# Patient Record
Sex: Male | Born: 2000 | Race: White | Hispanic: No | Marital: Single | Smoking: Never smoker
Health system: Southern US, Community
[De-identification: ages and names within clinical notes are randomized; demographics above are authoritative.]

## PROBLEM LIST (undated history)

## (undated) DIAGNOSIS — F952 Tourette's disorder: Secondary | ICD-10-CM

## (undated) DIAGNOSIS — F419 Anxiety disorder, unspecified: Secondary | ICD-10-CM

## (undated) DIAGNOSIS — K760 Fatty (change of) liver, not elsewhere classified: Secondary | ICD-10-CM

## (undated) DIAGNOSIS — J45909 Unspecified asthma, uncomplicated: Secondary | ICD-10-CM

## (undated) DIAGNOSIS — F840 Autistic disorder: Secondary | ICD-10-CM

## (undated) DIAGNOSIS — F909 Attention-deficit hyperactivity disorder, unspecified type: Secondary | ICD-10-CM

## (undated) DIAGNOSIS — F913 Oppositional defiant disorder: Secondary | ICD-10-CM

## (undated) DIAGNOSIS — F988 Other specified behavioral and emotional disorders with onset usually occurring in childhood and adolescence: Secondary | ICD-10-CM

## (undated) HISTORY — PX: DENTAL SURGERY: SHX609

## (undated) HISTORY — PX: NO PAST SURGERIES: SHX2092

## (undated) HISTORY — PX: OTHER SURGICAL HISTORY: SHX169

## (undated) HISTORY — DX: Fatty (change of) liver, not elsewhere classified: K76.0

## (undated) HISTORY — DX: Tourette's disorder: F95.2

## (undated) HISTORY — DX: Anxiety disorder, unspecified: F41.9

---
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
## 2022-11-24 ENCOUNTER — Encounter: Payer: Self-pay | Admitting: Gastroenterology

## 2022-11-24 ENCOUNTER — Ambulatory Visit: Payer: Medicaid Other | Admitting: Gastroenterology

## 2022-11-24 ENCOUNTER — Ambulatory Visit (INDEPENDENT_AMBULATORY_CARE_PROVIDER_SITE_OTHER): Payer: Medicaid Other | Admitting: Gastroenterology

## 2022-11-24 ENCOUNTER — Encounter: Payer: Self-pay | Admitting: *Deleted

## 2022-11-24 VITALS — BP 107/70 | HR 106 | Temp 98.0°F | Ht 72.0 in | Wt 199.0 lb

## 2022-11-24 DIAGNOSIS — R1012 Left upper quadrant pain: Secondary | ICD-10-CM | POA: Diagnosis not present

## 2022-11-24 DIAGNOSIS — R1310 Dysphagia, unspecified: Secondary | ICD-10-CM | POA: Diagnosis not present

## 2022-11-24 DIAGNOSIS — R197 Diarrhea, unspecified: Secondary | ICD-10-CM

## 2022-11-24 DIAGNOSIS — K219 Gastro-esophageal reflux disease without esophagitis: Secondary | ICD-10-CM | POA: Diagnosis not present

## 2022-11-24 MED ORDER — DICYCLOMINE HCL 10 MG PO CAPS: 10.0000 mg | ORAL_CAPSULE | Freq: Three times a day (TID) | ORAL | 1 refills | Status: DC

## 2022-11-24 MED ORDER — PANTOPRAZOLE SODIUM 40 MG PO TBEC: 40.0000 mg | DELAYED_RELEASE_TABLET | Freq: Every day | ORAL | 3 refills | Status: DC

## 2022-11-24 NOTE — Patient Instructions (Addendum)
Please have blood work completed at [facility].  We will arrange to have an upper endoscopy with possible stretching of your esophagus in the near future with Dr. Marletta Lor at [facility].  The cause of your diarrhea is not entirely clear.  You may have a component of IBS versus dietary intolerances.  I am checking for inflammation in your colon as well as screening for celiac disease with your blood work.  I also recommend that you follow a lactose-free diet for now or take Lactaid tablets prior to consuming any dairy products.  For your reflux symptoms, start pantoprazole 40 mg once daily 30 minutes before breakfast.  Swallowing precautions:  Eat slowly, take small bites, chew thoroughly, drink plenty of liquids throughout meals.  Avoid trough textures All meats should be chopped finely.  If something gets hung in your esophagus and will not come up or go down, proceed to the emergency room.    The pain that you experience in the left upper abdomen is more consistent with a musculoskeletal cause as your symptoms are related to certain activities.  However, when we perform the upper endoscopy, we will be able to evaluate if there is any inflammation within your stomach contributing to the symptoms.  We will follow-up with you in the office in 3 months or sooner if needed.  It was very nice to meet you today!  Ermalinda Memos, PA-C [facility]

## 2022-11-24 NOTE — Progress Notes (Deleted)
GI Office Note    Referring Provider: Antonietta Jewel Primary Care Physician:  Antonietta Jewel  Primary Gastroenterologist:  Chief Complaint   No chief complaint on file.    History of Present Illness   Adam Gallegos is a 22 y.o. male presenting today          Medications   Current Outpatient Medications  Medication Sig Dispense Refill   albuterol (PROAIR HFA) 108 (90 BASE) MCG/ACT inhaler Inhale 2 puffs into the lungs 4 (four) times daily.     chlorhexidine (HIBICLENS) 4 % external liquid Apply topically daily as needed. 120 mL 0   levocetirizine (XYZAL) 5 MG tablet Take 5 mg by mouth every evening.     montelukast (SINGULAIR) 10 MG tablet Take 10 mg by mouth at bedtime.     mupirocin ointment (BACTROBAN) 2 % Apply 1 application topically 2 (two) times daily. 22 g 0   No current facility-administered medications for this visit.    Allergies   Allergies as of 11/24/2022   (No Known Allergies)    Past Medical History   Past Medical History:  Diagnosis Date   ADD (attention deficit disorder)    ADHD (attention deficit hyperactivity disorder)    ADHD (attention deficit hyperactivity disorder)    Anxiety    Anxiety    Asthma    Autism    Autism    ODD (oppositional defiant disorder)    Tourette's    Tourette's disease     Past Surgical History   Past Surgical History:  Procedure Laterality Date   asthma     NO PAST SURGERIES      Past Family History   Family History  Problem Relation Age of Onset   Cancer Mother     Past Social History   Social History   Socioeconomic History   Marital status: Single    Spouse name: Not on file   Number of children: Not on file   Years of education: Not on file   Highest education level: Not on file  Occupational History   Not on file  Tobacco Use   Smoking status: Never   Smokeless tobacco: Current    Types: Snuff  Vaping Use   Vaping Use: Never used  Substance and Sexual  Activity   Alcohol use: No   Drug use: No   Sexual activity: Yes    Birth control/protection: Condom  Other Topics Concern   Not on file  Social History Narrative   Not on file   Social Determinants of Health   Financial Resource Strain: Not on file  Food Insecurity: Not on file  Transportation Needs: Not on file  Physical Activity: Not on file  Stress: Not on file  Social Connections: Not on file  Intimate Partner Violence: Not on file    Review of Systems   General: Negative for anorexia, weight loss, fever, chills, fatigue, weakness. Eyes: Negative for vision changes.  ENT: Negative for hoarseness, difficulty swallowing , nasal congestion. CV: Negative for chest pain, angina, palpitations, dyspnea on exertion, peripheral edema.  Respiratory: Negative for dyspnea at rest, dyspnea on exertion, cough, sputum, wheezing.  GI: See history of present illness. GU:  Negative for dysuria, hematuria, urinary incontinence, urinary frequency, nocturnal urination.  MS: Negative for joint pain, low back pain.  Derm: Negative for rash or itching.  Neuro: Negative for weakness, abnormal sensation, seizure, frequent headaches, memory loss,  confusion.  Psych: Negative for  anxiety, depression, suicidal ideation, hallucinations.  Endo: Negative for unusual weight change.  Heme: Negative for bruising or bleeding. Allergy: Negative for rash or hives.  Physical Exam   There were no vitals taken for this visit.   General: Well-nourished, well-developed in no acute distress.  Head: Normocephalic, atraumatic.   Eyes: Conjunctiva pink, no icterus. Mouth: Oropharyngeal mucosa moist and pink , no lesions erythema or exudate. Neck: Supple without thyromegaly, masses, or lymphadenopathy.  Lungs: Clear to auscultation bilaterally.  Heart: Regular rate and rhythm, no murmurs rubs or gallops.  Abdomen: Bowel sounds are normal, nontender, nondistended, no hepatosplenomegaly or masses,  no  abdominal bruits or hernia, no rebound or guarding.   Rectal: *** Extremities: No lower extremity edema. No clubbing or deformities.  Neuro: Alert and oriented x 4 , grossly normal neurologically.  Skin: Warm and dry, no rash or jaundice.   Psych: Alert and cooperative, normal mood and affect.  Labs   *** Imaging Studies   No results found.  Assessment       PLAN   ***   Leanna Battles. Melvyn Neth, MHS, PA-C [facility]

## 2022-11-24 NOTE — Progress Notes (Signed)
Referring Provider: Antonietta Jewel Primary Care Physician:  Antonietta Jewel Primary Gastroenterologist:  Dr. Marletta Lor  Chief Complaint  Patient presents with   Abdominal Pain    Left side abdominal pain. Diarrhea right after he eats.     HPI:   Adam Gallegos is a 22 y.o. male presenting today at the request of Dion Saucier, PA-C for chronic diarrhea.   Patient reports started a couple years ago but has been worsening.  Reports he is having more than 3 bowel movements per day, loses track after 2 or 3.  No nocturnal stools.  Bowel movements usually are after meals, but can be throughout the day with no certain food triggers.  Denies BRBPR or melena, unintentional weight loss, abdominal pain related to bowel movements.  Medications tried: Anti diarrheal, but this caused abdominal burning. Has also tried fiber but this didn't help.   Thinks someone in family with colon cancer. Not sure who. No family history of crohn's disease or UC.   Has burning LUQ abdominal pain. More associated with specific activities/picking up his son and holding him.  Not triggered by meals.  No nausea or vomiting.  He did have an episode of sharp upper abdominal pain with nausea and vomiting several months ago and then again a couple weeks ago, but no routine symptoms.  Not sure of the cause.  Has reflux all the time.  Reports this dates back to childhood.  Taking tums. Breads and steak will get hung in his esophagus. No prior EGD.     Past Medical History:  Diagnosis Date   ADD (attention deficit disorder)    ADHD (attention deficit hyperactivity disorder)    ADHD (attention deficit hyperactivity disorder)    Anxiety    Anxiety    Asthma    Autism    Autism    Hepatic steatosis    ODD (oppositional defiant disorder)    Tourette's    Tourette's disease     Past Surgical History:  Procedure Laterality Date   asthma     NO PAST SURGERIES      Current Outpatient Medications   Medication Sig Dispense Refill   albuterol (PROAIR HFA) 108 (90 BASE) MCG/ACT inhaler Inhale 2 puffs into the lungs 4 (four) times daily.     chlorhexidine (HIBICLENS) 4 % external liquid Apply topically daily as needed. 120 mL 0   dicyclomine (BENTYL) 10 MG capsule Take 1 capsule (10 mg total) by mouth 4 (four) times daily -  before meals and at bedtime. 120 capsule 1   levocetirizine (XYZAL) 5 MG tablet Take 5 mg by mouth every evening.     montelukast (SINGULAIR) 10 MG tablet Take 10 mg by mouth at bedtime.     mupirocin ointment (BACTROBAN) 2 % Apply 1 application topically 2 (two) times daily. 22 g 0   pantoprazole (PROTONIX) 40 MG tablet Take 1 tablet (40 mg total) by mouth daily before breakfast. 30 tablet 3   No current facility-administered medications for this visit.    Allergies as of 11/24/2022 - Review Complete 11/24/2022  Allergen Reaction Noted   Penicillins Hives 04/19/2022   Latex  04/28/2020    Family History  Problem Relation Age of Onset   Cancer Mother    Colon cancer Other        Think someone in his family had colon cancer, not sure who   Inflammatory bowel disease Neg Hx     Social History  Socioeconomic History   Marital status: Single    Spouse name: Not on file   Number of children: Not on file   Years of education: Not on file   Highest education level: Not on file  Occupational History   Not on file  Tobacco Use   Smoking status: Never   Smokeless tobacco: Current    Types: Snuff  Vaping Use   Vaping Use: Never used  Substance and Sexual Activity   Alcohol use: No   Drug use: No   Sexual activity: Yes    Birth control/protection: Condom  Other Topics Concern   Not on file  Social History Narrative   Not on file   Social Determinants of Health   Financial Resource Strain: Not on file  Food Insecurity: Not on file  Transportation Needs: Not on file  Physical Activity: Not on file  Stress: Not on file  Social Connections: Not on  file  Intimate Partner Violence: Not on file    Review of Systems: Gen: Denies any fever, chills, cold or flulike symptoms, presyncope, syncope. CV: Denies chest pain, heart palpitations. Resp: Denies shortness of breath, cough. GI: See HPI GU : Denies urinary burning, urinary frequency, urinary hesitancy MS: Denies joint pain. Derm: Denies rash. Psych: Denies depression, anxiety. Heme: See HPI  Physical Exam: BP 107/70 (BP Location: Right Arm, Patient Position: Sitting, Cuff Size: Normal)   Pulse (!) 106   Temp 98 F (36.7 C) (Temporal)   Ht 6' (1.829 m)   Wt 199 lb (90.3 kg)   SpO2 96%   BMI 26.99 kg/m  General:   Alert and oriented. Pleasant and cooperative. Well-nourished and well-developed.  Head:  Normocephalic and atraumatic. Eyes:  Without icterus, sclera clear and conjunctiva pink.  Ears:  Normal auditory acuity. Lungs:  Clear to auscultation bilaterally. No wheezes, rales, or rhonchi. No distress.  Heart:  S1, S2 present without murmurs appreciated.  Abdomen:  +BS, soft, non-tender and non-distended. No HSM noted. No guarding or rebound. No masses appreciated.  Rectal:  Deferred  Msk:  Symmetrical without gross deformities. Normal posture. Extremities:  Without edema. Neurologic:  Alert and  oriented x4;  grossly normal neurologically. Skin:  Intact without significant lesions or rashes. Psych:  Normal mood and affect.    Assessment:  22 year old male presenting today at the request of Adline Potter, PA-C for chronic diarrhea.  Patient also reporting LUQ abdominal pain, GERD, dysphagia.  Chronic diarrhea: Started 2 years ago, but has been worsening, now with daily loose stools, often after meals, but can occur throughout the day as well.  No BRBPR, melena, unintentional weight loss.  He does have abdominal pain related to his bowel movements.  Recent TSH within normal limits.  Suspect IBS, possible dietary intolerances.  I will plan to start him on dicyclomine,  but will also screen for celiac disease and check inflammatory markers.  LUQ abdominal pain: Suspect MSK etiology as this is usually triggered by certain activities/picking up his son.  Recent labs on file in May with normal LFTs.  No association with meals though he does have chronic, daily GERD could very well have gastritis.  Additionally, he has dysphagia as discussed below for which we are pursuing an EGD.  This will help rule out GI sources of his abdominal pain.  GERD: Chronic.  Uncontrolled with daily symptoms, currently taking Tums.  I will start him on pantoprazole 40 mg daily.  Dysphagia: Reports intermittent solid food dysphagia.  This is in  the setting of uncontrolled GERD.  May have esophageal web, ring, stricture.  Additionally, he does have history of asthma, allergies, and could have EOE.  We will arrange for an upper endoscopy with possible dilation.  Plan:  IgA, TTG IgA, CRP, sed rate Proceed with upper endoscopy +/- dilation with propofol by Dr. Marletta Lor in near future. The risks, benefits, and alternatives have been discussed with the patient in detail. The patient states understanding and desires to proceed.  ASA 2 Dicyclomine 10 mg up to 3 times daily before meals and at bedtime. Lactose-free diet or Lactaid tablets prior to dairy consumption. Start pantoprazole 40 mg daily 30 minutes before breakfast. Swallowing precautions discussed.  Separate written instructions provided on AVS. Follow-up in 3 months or sooner if needed.   Ermalinda Memos, PA-C [facility] 11/24/2022

## 2022-11-24 NOTE — H&P (View-Only) (Signed)
 Referring Provider: Boles, Alyssa H, PA-C Primary Care Physician:  Boles, Alyssa H, PA-C Primary Gastroenterologist:  Dr. Carver  Chief Complaint  Patient presents with   Abdominal Pain    Left side abdominal pain. Diarrhea right after he eats.     HPI:   Adam Gallegos is a 21 y.o. male presenting today at the request of Boles, Alyssa H, PA-C for chronic diarrhea.   Patient reports started a couple years ago but has been worsening.  Reports he is having more than 3 bowel movements per day, loses track after 2 or 3.  No nocturnal stools.  Bowel movements usually are after meals, but can be throughout the day with no certain food triggers.  Denies BRBPR or melena, unintentional weight loss, abdominal pain related to bowel movements.  Medications tried: Anti diarrheal, but this caused abdominal burning. Has also tried fiber but this didn't help.   Thinks someone in family with colon cancer. Not sure who. No family history of crohn's disease or UC.   Has burning LUQ abdominal pain. More associated with specific activities/picking up his son and holding him.  Not triggered by meals.  No nausea or vomiting.  He did have an episode of sharp upper abdominal pain with nausea and vomiting several months ago and then again a couple weeks ago, but no routine symptoms.  Not sure of the cause.  Has reflux all the time.  Reports this dates back to childhood.  Taking tums. Breads and steak will get hung in his esophagus. No prior EGD.     Past Medical History:  Diagnosis Date   ADD (attention deficit disorder)    ADHD (attention deficit hyperactivity disorder)    ADHD (attention deficit hyperactivity disorder)    Anxiety    Anxiety    Asthma    Autism    Autism    Hepatic steatosis    ODD (oppositional defiant disorder)    Tourette's    Tourette's disease     Past Surgical History:  Procedure Laterality Date   asthma     NO PAST SURGERIES      Current Outpatient Medications   Medication Sig Dispense Refill   albuterol (PROAIR HFA) 108 (90 BASE) MCG/ACT inhaler Inhale 2 puffs into the lungs 4 (four) times daily.     chlorhexidine (HIBICLENS) 4 % external liquid Apply topically daily as needed. 120 mL 0   dicyclomine (BENTYL) 10 MG capsule Take 1 capsule (10 mg total) by mouth 4 (four) times daily -  before meals and at bedtime. 120 capsule 1   levocetirizine (XYZAL) 5 MG tablet Take 5 mg by mouth every evening.     montelukast (SINGULAIR) 10 MG tablet Take 10 mg by mouth at bedtime.     mupirocin ointment (BACTROBAN) 2 % Apply 1 application topically 2 (two) times daily. 22 g 0   pantoprazole (PROTONIX) 40 MG tablet Take 1 tablet (40 mg total) by mouth daily before breakfast. 30 tablet 3   No current facility-administered medications for this visit.    Allergies as of 11/24/2022 - Review Complete 11/24/2022  Allergen Reaction Noted   Penicillins Hives 04/19/2022   Latex  04/28/2020    Family History  Problem Relation Age of Onset   Cancer Mother    Colon cancer Other        Think someone in his family had colon cancer, not sure who   Inflammatory bowel disease Neg Hx     Social History     Socioeconomic History   Marital status: Single    Spouse name: Not on file   Number of children: Not on file   Years of education: Not on file   Highest education level: Not on file  Occupational History   Not on file  Tobacco Use   Smoking status: Never   Smokeless tobacco: Current    Types: Snuff  Vaping Use   Vaping Use: Never used  Substance and Sexual Activity   Alcohol use: No   Drug use: No   Sexual activity: Yes    Birth control/protection: Condom  Other Topics Concern   Not on file  Social History Narrative   Not on file   Social Determinants of Health   Financial Resource Strain: Not on file  Food Insecurity: Not on file  Transportation Needs: Not on file  Physical Activity: Not on file  Stress: Not on file  Social Connections: Not on  file  Intimate Partner Violence: Not on file    Review of Systems: Gen: Denies any fever, chills, cold or flulike symptoms, presyncope, syncope. CV: Denies chest pain, heart palpitations. Resp: Denies shortness of breath, cough. GI: See HPI GU : Denies urinary burning, urinary frequency, urinary hesitancy MS: Denies joint pain. Derm: Denies rash. Psych: Denies depression, anxiety. Heme: See HPI  Physical Exam: BP 107/70 (BP Location: Right Arm, Patient Position: Sitting, Cuff Size: Normal)   Pulse (!) 106   Temp 98 F (36.7 C) (Temporal)   Ht 6' (1.829 m)   Wt 199 lb (90.3 kg)   SpO2 96%   BMI 26.99 kg/m  General:   Alert and oriented. Pleasant and cooperative. Well-nourished and well-developed.  Head:  Normocephalic and atraumatic. Eyes:  Without icterus, sclera clear and conjunctiva pink.  Ears:  Normal auditory acuity. Lungs:  Clear to auscultation bilaterally. No wheezes, rales, or rhonchi. No distress.  Heart:  S1, S2 present without murmurs appreciated.  Abdomen:  +BS, soft, non-tender and non-distended. No HSM noted. No guarding or rebound. No masses appreciated.  Rectal:  Deferred  Msk:  Symmetrical without gross deformities. Normal posture. Extremities:  Without edema. Neurologic:  Alert and  oriented x4;  grossly normal neurologically. Skin:  Intact without significant lesions or rashes. Psych:  Normal mood and affect.    Assessment:  21-year-old male presenting today at the request of Alyssa Bowles, PA-C for chronic diarrhea.  Patient also reporting LUQ abdominal pain, GERD, dysphagia.  Chronic diarrhea: Started 2 years ago, but has been worsening, now with daily loose stools, often after meals, but can occur throughout the day as well.  No BRBPR, melena, unintentional weight loss.  He does have abdominal pain related to his bowel movements.  Recent TSH within normal limits.  Suspect IBS, possible dietary intolerances.  I will plan to start him on dicyclomine,  but will also screen for celiac disease and check inflammatory markers.  LUQ abdominal pain: Suspect MSK etiology as this is usually triggered by certain activities/picking up his son.  Recent labs on file in May with normal LFTs.  No association with meals though he does have chronic, daily GERD could very well have gastritis.  Additionally, he has dysphagia as discussed below for which we are pursuing an EGD.  This will help rule out GI sources of his abdominal pain.  GERD: Chronic.  Uncontrolled with daily symptoms, currently taking Tums.  I will start him on pantoprazole 40 mg daily.  Dysphagia: Reports intermittent solid food dysphagia.  This is in   the setting of uncontrolled GERD.  May have esophageal web, ring, stricture.  Additionally, he does have history of asthma, allergies, and could have EOE.  We will arrange for an upper endoscopy with possible dilation.  Plan:  IgA, TTG IgA, CRP, sed rate Proceed with upper endoscopy +/- dilation with propofol by Dr. Carver in near future. The risks, benefits, and alternatives have been discussed with the patient in detail. The patient states understanding and desires to proceed.  ASA 2 Dicyclomine 10 mg up to 3 times daily before meals and at bedtime. Lactose-free diet or Lactaid tablets prior to dairy consumption. Start pantoprazole 40 mg daily 30 minutes before breakfast. Swallowing precautions discussed.  Separate written instructions provided on AVS. Follow-up in 3 months or sooner if needed.   Macel Yearsley, PA-C [facility] 11/24/2022   

## 2022-11-26 ENCOUNTER — Encounter: Payer: Self-pay | Admitting: Gastroenterology

## 2022-11-26 DIAGNOSIS — R1310 Dysphagia, unspecified: Secondary | ICD-10-CM | POA: Insufficient documentation

## 2022-11-26 DIAGNOSIS — K219 Gastro-esophageal reflux disease without esophagitis: Secondary | ICD-10-CM | POA: Insufficient documentation

## 2022-11-26 DIAGNOSIS — R1012 Left upper quadrant pain: Secondary | ICD-10-CM | POA: Insufficient documentation

## 2022-11-26 DIAGNOSIS — R197 Diarrhea, unspecified: Secondary | ICD-10-CM | POA: Insufficient documentation

## 2022-11-29 ENCOUNTER — Telehealth: Payer: Self-pay | Admitting: *Deleted

## 2022-11-29 NOTE — Telephone Encounter (Signed)
Spoke with pt. He is aware Dr. Marletta Lor not available on 7/2 for procedure. Rescheduled to 7/10. Aware will get a pre-op phone call 1-2 days prior. Endo aware of new appt. New Instructions sent.

## 2022-11-30 ENCOUNTER — Encounter: Payer: Self-pay | Admitting: Gastroenterology

## 2022-12-13 ENCOUNTER — Encounter (HOSPITAL_COMMUNITY)
Admission: RE | Admit: 2022-12-13 | Discharge: 2022-12-13 | Disposition: A | Discharge status: Home / Self Care | Payer: MEDICAID | Source: Ambulatory Visit | Attending: Internal Medicine | Admitting: Internal Medicine

## 2022-12-13 ENCOUNTER — Encounter (HOSPITAL_COMMUNITY): Payer: Self-pay

## 2022-12-13 NOTE — Pre-Procedure Instructions (Signed)
Attempted pre-op phone call. Left VM for him to call us back. 

## 2022-12-15 ENCOUNTER — Encounter (HOSPITAL_COMMUNITY)
Admission: RE | Disposition: A | Discharge status: Home / Self Care | Payer: Self-pay | Source: Home / Self Care | Attending: Internal Medicine

## 2022-12-15 ENCOUNTER — Encounter (HOSPITAL_COMMUNITY): Payer: Self-pay

## 2022-12-15 ENCOUNTER — Ambulatory Visit (HOSPITAL_COMMUNITY)
Admission: RE | Admit: 2022-12-15 | Discharge: 2022-12-15 | Disposition: A | Discharge status: Home / Self Care | Payer: MEDICAID | Attending: Internal Medicine | Admitting: Internal Medicine

## 2022-12-15 ENCOUNTER — Ambulatory Visit (HOSPITAL_COMMUNITY): Payer: MEDICAID | Admitting: Anesthesiology

## 2022-12-15 DIAGNOSIS — K2970 Gastritis, unspecified, without bleeding: Secondary | ICD-10-CM

## 2022-12-15 DIAGNOSIS — R1310 Dysphagia, unspecified: Secondary | ICD-10-CM | POA: Diagnosis not present

## 2022-12-15 DIAGNOSIS — K200 Eosinophilic esophagitis: Secondary | ICD-10-CM | POA: Diagnosis not present

## 2022-12-15 DIAGNOSIS — K449 Diaphragmatic hernia without obstruction or gangrene: Secondary | ICD-10-CM

## 2022-12-15 DIAGNOSIS — K529 Noninfective gastroenteritis and colitis, unspecified: Secondary | ICD-10-CM | POA: Diagnosis not present

## 2022-12-15 DIAGNOSIS — J45909 Unspecified asthma, uncomplicated: Secondary | ICD-10-CM | POA: Diagnosis not present

## 2022-12-15 DIAGNOSIS — R12 Heartburn: Secondary | ICD-10-CM | POA: Diagnosis not present

## 2022-12-15 DIAGNOSIS — R1012 Left upper quadrant pain: Secondary | ICD-10-CM | POA: Diagnosis not present

## 2022-12-15 DIAGNOSIS — K2100 Gastro-esophageal reflux disease with esophagitis, without bleeding: Secondary | ICD-10-CM | POA: Diagnosis not present

## 2022-12-15 DIAGNOSIS — K219 Gastro-esophageal reflux disease without esophagitis: Secondary | ICD-10-CM

## 2022-12-15 HISTORY — PX: BIOPSY: SHX5522

## 2022-12-15 HISTORY — PX: BALLOON DILATION: SHX5330

## 2022-12-15 HISTORY — PX: ESOPHAGOGASTRODUODENOSCOPY (EGD) WITH PROPOFOL: SHX5813

## 2022-12-15 SURGERY — ESOPHAGOGASTRODUODENOSCOPY (EGD) WITH PROPOFOL
Anesthesia: General

## 2022-12-15 MED ORDER — LIDOCAINE HCL (PF) 2 % IJ SOLN
INTRAMUSCULAR | Status: DC | PRN
  Administered 2022-12-15: 50 mg via INTRADERMAL

## 2022-12-15 MED ORDER — PROPOFOL 500 MG/50ML IV EMUL
INTRAVENOUS | Status: DC | PRN
  Administered 2022-12-15: 200 ug/kg/min via INTRAVENOUS

## 2022-12-15 MED ORDER — PANTOPRAZOLE SODIUM 40 MG PO TBEC: 40.0000 mg | DELAYED_RELEASE_TABLET | Freq: Two times a day (BID) | ORAL | 11 refills | Status: DC

## 2022-12-15 MED ORDER — LACTATED RINGERS IV SOLN: INTRAVENOUS | Status: DC

## 2022-12-15 MED ORDER — PROPOFOL 500 MG/50ML IV EMUL
INTRAVENOUS | Status: AC
  Filled 2022-12-15: qty 50

## 2022-12-15 MED ORDER — PROPOFOL 10 MG/ML IV BOLUS
INTRAVENOUS | Status: DC | PRN
  Administered 2022-12-15: 150 mg via INTRAVENOUS

## 2022-12-15 NOTE — Op Note (Signed)
[facility] Patient Name: Adam Gallegos Procedure Date: 12/15/2022 2:05 PM MRN: [id number] Date of Birth: January 11, 2001 Attending MD: Hennie Duos. Marletta Lor , [place], [id number] CSN: [id number] Age: 22 Admit Type: Outpatient Procedure:                Upper GI endoscopy Indications:              Dysphagia, Heartburn Providers:                Hennie Duos. Marletta Lor, DO, Nena Polio, RN, Zena Amos Referring MD:              Medicines:                See the Anesthesia note for documentation of the                            administered medications Complications:            No immediate complications. Estimated Blood Loss:     Estimated blood loss was minimal. Procedure:                Pre-Anesthesia Assessment:                           - The anesthesia plan was to use monitored                            anesthesia care (MAC).                           After obtaining informed consent, the endoscope was                            passed under direct vision. Throughout the                            procedure, the patient's blood pressure, pulse, and                            oxygen saturations were monitored continuously. The                            [id number]-H190 ([id number]) scope was introduced through the                            mouth, and advanced to the second part of duodenum.                            The upper GI endoscopy was accomplished without                            difficulty. The patient tolerated the procedure                            well. Scope In: 2:19:12 PM Scope  Out: 2:23:21 PM Total Procedure Duration: 0 hours 4 minutes 9 seconds  Findings:      Mucosal changes including feline appearance, longitudinal furrows and       white plaques were found in the middle third of the esophagus. Biopsies       were taken with a cold forceps for histology.      The Z-line was irregular. Biopsies were taken with a cold forceps for        histology.      A small hiatal hernia was present.      Patchy mild inflammation characterized by erythema was found in the       gastric body. Biopsies were taken with a cold forceps for Helicobacter       pylori testing.      The duodenal bulb, first portion of the duodenum and second portion of       the duodenum were normal. Impression:               - Esophageal mucosal changes suggestive of                            eosinophilic esophagitis. Biopsied.                           - Z-line irregular. Biopsied.                           - Small hiatal hernia.                           - Gastritis. Biopsied.                           - Normal duodenal bulb, first portion of the                            duodenum and second portion of the duodenum. Moderate Sedation:      Per Anesthesia Care Recommendation:           - Patient has a contact number available for                            emergencies. The signs and symptoms of potential                            delayed complications were discussed with the                            patient. Return to normal activities tomorrow.                            Written discharge instructions were provided to the                            patient.                           - Resume previous diet.                           -  Continue present medications.                           - Await pathology results.                           - Repeat upper endoscopy after studies are complete                            for surveillance based on pathology results.                           - Return to GI clinic in 6 weeks.                           - Use a proton pump inhibitor PO BID. Procedure Code(s):        --- Professional ---                           [phone], Esophagogastroduodenoscopy, flexible,                            transoral; with biopsy, single or multiple Diagnosis Code(s):        --- Professional ---                           K22.89,  Other specified disease of esophagus                           K44.9, Diaphragmatic hernia without obstruction or                            gangrene                           K29.70, Gastritis, unspecified, without bleeding                           R13.10, Dysphagia, unspecified                           R12, Heartburn CPT copyright 2022 American Medical Association. All rights reserved. The codes documented in this report are preliminary and upon coder review may  be revised to meet current compliance requirements. Hennie Duos. Marletta Lor, DO Hennie Duos. Marletta Lor, DO 12/15/2022 2:26:35 PM This report has been signed electronically. Number of Addenda: 0

## 2022-12-15 NOTE — Discharge Instructions (Addendum)
EGD Discharge instructions Please read the instructions outlined below and refer to this sheet in the next few weeks. These discharge instructions provide you with general information on caring for yourself after you leave the hospital. Your doctor may also give you specific instructions. While your treatment has been planned according to the most current medical practices available, unavoidable complications occasionally occur. If you have any problems or questions after discharge, please call your doctor. ACTIVITY You may resume your regular activity but move at a slower pace for the next 24 hours.  Take frequent rest periods for the next 24 hours.  Walking will help expel (get rid of) the air and reduce the bloated feeling in your abdomen.  No driving for 24 hours (because of the anesthesia (medicine) used during the test).  You may shower.  Do not sign any important legal documents or operate any machinery for 24 hours (because of the anesthesia used during the test).  NUTRITION Drink plenty of fluids.  You may resume your normal diet.  Begin with a light meal and progress to your normal diet.  Avoid alcoholic beverages for 24 hours or as instructed by your caregiver.  MEDICATIONS You may resume your normal medications unless your caregiver tells you otherwise.  WHAT YOU CAN EXPECT TODAY You may experience abdominal discomfort such as a feeling of fullness or "gas" pains.  FOLLOW-UP Your doctor will discuss the results of your test with you.  SEEK IMMEDIATE MEDICAL ATTENTION IF ANY OF THE FOLLOWING OCCUR: Excessive nausea (feeling sick to your stomach) and/or vomiting.  Severe abdominal pain and distention (swelling).  Trouble swallowing.  Temperature over 101 F (37.8 C).  Rectal bleeding or vomiting of blood.   Your upper endoscopy revealed findings consistent with eosinophilic esophagitis which is an allergy mediated process.  I took samples of your esophagus as well as your  stomach.  Small bowel appeared normal.  I am going to increase your pantoprazole to twice daily.  Await pathology results, my office will contact you.  Follow-up in GI office in 6 to 8 weeks.   I hope you have a great rest of your week!  Hennie Duos. Marletta Lor, D.O. Gastroenterology and Hepatology [facility]

## 2022-12-15 NOTE — Transfer of Care (Signed)
Immediate Anesthesia Transfer of Care Note  Patient: Adam Gallegos  Procedure(s) Performed: ESOPHAGOGASTRODUODENOSCOPY (EGD) WITH PROPOFOL BALLOON DILATION BIOPSY  Patient Location: Short Stay  Anesthesia Type:General  Level of Consciousness: awake, alert , and oriented  Airway & Oxygen Therapy: Patient Spontanous Breathing  Post-op Assessment: Report given to RN, Post -op Vital signs reviewed and stable, Patient moving all extremities X 4, and Patient able to stick tongue midline  Post vital signs: Reviewed  Last Vitals:  Vitals Value Taken Time  BP 99/40   Temp 98.6   Pulse 97   Resp 25   SpO2 96     Last Pain: There were no vitals filed for this visit.       Complications: No notable events documented.

## 2022-12-15 NOTE — Anesthesia Postprocedure Evaluation (Signed)
Anesthesia Post Note  Patient: Adam Gallegos  Procedure(s) Performed: ESOPHAGOGASTRODUODENOSCOPY (EGD) WITH PROPOFOL BALLOON DILATION BIOPSY  Patient location during evaluation: Phase II Anesthesia Type: General Level of consciousness: awake and alert and oriented Pain management: pain level controlled Vital Signs Assessment: post-procedure vital signs reviewed and stable Respiratory status: spontaneous breathing, nonlabored ventilation and respiratory function stable Cardiovascular status: blood pressure returned to baseline and stable Postop Assessment: no apparent nausea or vomiting Anesthetic complications: no  No notable events documented.   Last Vitals:  Vitals:   12/15/22 1432 12/15/22 1437  BP: (!) 79/40 98/60  Pulse: (!) 102   Resp: (!) 26   Temp: 36.9 C   SpO2: 98%     Last Pain:  Vitals:   12/15/22 1432  TempSrc: Oral  PainSc: 0-No pain                 Javohn Basey C Sohail Capraro

## 2022-12-15 NOTE — Anesthesia Preprocedure Evaluation (Signed)
Anesthesia Evaluation  Patient identified by MRN, date of birth, ID band Patient awake    Reviewed: Allergy & Precautions, H&P , NPO status , Patient's Chart, lab work & pertinent test results  Airway Mallampati: II  TM Distance: >3 FB Neck ROM: Full    Dental  (+) Chipped, Dental Advisory Given, Poor Dentition   Pulmonary asthma , Patient abstained from smoking.          Cardiovascular negative cardio ROS Normal cardiovascular exam Rhythm:Regular Rate:Normal     Neuro/Psych  PSYCHIATRIC DISORDERS Anxiety     negative neurological ROS     GI/Hepatic Neg liver ROS,GERD  Medicated and Poorly Controlled,,  Endo/Other  negative endocrine ROS    Renal/GU negative Renal ROS  negative genitourinary   Musculoskeletal negative musculoskeletal ROS (+)    Abdominal   Peds  (+) ADHD Hematology negative hematology ROS (+)   Anesthesia Other Findings   Reproductive/Obstetrics negative OB ROS                             Anesthesia Physical Anesthesia Plan  ASA: 2  Anesthesia Plan: General   Post-op Pain Management: Minimal or no pain anticipated   Induction: Intravenous  PONV Risk Score and Plan: 1 and Propofol infusion  Airway Management Planned: Nasal Cannula and Natural Airway  Additional Equipment:   Intra-op Plan:   Post-operative Plan:   Informed Consent: I have reviewed the patients History and Physical, chart, labs and discussed the procedure including the risks, benefits and alternatives for the proposed anesthesia with the patient or authorized representative who has indicated his/her understanding and acceptance.     Dental advisory given  Plan Discussed with: CRNA and Surgeon  Anesthesia Plan Comments:        Anesthesia Quick Evaluation

## 2022-12-15 NOTE — Interval H&P Note (Signed)
History and Physical Interval Note:  12/15/2022 1:54 PM  Adam Gallegos  has presented today for surgery, with the diagnosis of dysphagia,LUQ pain,GERD.  The various methods of treatment have been discussed with the patient and family. After consideration of risks, benefits and other options for treatment, the patient has consented to  Procedure(s) with comments: ESOPHAGOGASTRODUODENOSCOPY (EGD) WITH PROPOFOL (N/A) - 2:30 pm, asa 2 BALLOON DILATION (N/A) as a surgical intervention.  The patient's history has been reviewed, patient examined, no change in status, stable for surgery.  I have reviewed the patient's chart and labs.  Questions were answered to the patient's satisfaction.     Lanelle Bal

## 2022-12-17 LAB — SURGICAL PATHOLOGY

## 2022-12-21 ENCOUNTER — Telehealth: Payer: Self-pay | Admitting: Internal Medicine

## 2022-12-21 ENCOUNTER — Encounter (HOSPITAL_COMMUNITY): Payer: Self-pay | Admitting: Internal Medicine

## 2022-12-21 NOTE — Telephone Encounter (Signed)
Phoned the pt and advised him once I get his results from Dr Marletta Lor I will call him. Pt expressed understanding

## 2022-12-21 NOTE — Telephone Encounter (Signed)
I called patient to reschedule his post procedure follow up with Ermalinda Memos, PA. Pt is starting a course at the [employer] and may need to reschedule due to his schedule. He was also asking about his EGD results. Please call [phone]

## 2023-01-06 ENCOUNTER — Ambulatory Visit: Payer: MEDICAID | Admitting: Gastroenterology

## 2023-01-06 ENCOUNTER — Encounter: Payer: Self-pay | Admitting: Gastroenterology

## 2023-01-06 VITALS — BP 98/61 | HR 87 | Temp 98.1°F | Ht 72.0 in | Wt 196.8 lb

## 2023-01-06 DIAGNOSIS — K219 Gastro-esophageal reflux disease without esophagitis: Secondary | ICD-10-CM | POA: Diagnosis not present

## 2023-01-06 DIAGNOSIS — R1310 Dysphagia, unspecified: Secondary | ICD-10-CM

## 2023-01-06 DIAGNOSIS — R112 Nausea with vomiting, unspecified: Secondary | ICD-10-CM

## 2023-01-06 DIAGNOSIS — R1084 Generalized abdominal pain: Secondary | ICD-10-CM

## 2023-01-06 DIAGNOSIS — K529 Noninfective gastroenteritis and colitis, unspecified: Secondary | ICD-10-CM

## 2023-01-06 MED ORDER — LIDOCAINE VISCOUS HCL 2 % MT SOLN: 15.0000 mL | OROMUCOSAL | 0 refills | Status: DC | PRN

## 2023-01-06 MED ORDER — DICYCLOMINE HCL 20 MG PO TABS: 20.0000 mg | ORAL_TABLET | Freq: Every day | ORAL | 1 refills | Status: DC | PRN

## 2023-01-06 MED ORDER — ONDANSETRON 4 MG PO TBDP: 4.0000 mg | ORAL_TABLET | Freq: Three times a day (TID) | ORAL | 0 refills | Status: DC | PRN

## 2023-01-06 MED ORDER — LIDOCAINE VISCOUS HCL 2 % MT SOLN: OROMUCOSAL | 0 refills | Status: DC

## 2023-01-06 MED ORDER — OMEPRAZOLE 40 MG PO CPDR: 40.0000 mg | DELAYED_RELEASE_CAPSULE | Freq: Two times a day (BID) | ORAL | 3 refills | Status: DC

## 2023-01-06 MED ORDER — ALUM & MAG HYDROXIDE-SIMETH 400-400-40 MG/5ML PO SUSP: 10.0000 mL | Freq: Four times a day (QID) | ORAL | 0 refills | Status: DC | PRN

## 2023-01-06 MED ORDER — SUCRALFATE 1 G PO TABS: 1.0000 g | ORAL_TABLET | Freq: Three times a day (TID) | ORAL | 0 refills | Status: DC

## 2023-01-06 NOTE — Progress Notes (Signed)
GI Office Note    Referring Provider: Antonietta Jewel Primary Care Physician:  Antonietta Jewel Primary Gastroenterologist: Hennie Duos. Marletta Lor, Adam Gallegos  Date:  01/06/2023  ID:  Adam Gallegos, DOB February 19, 2001, MRN [id number]   Chief Complaint   Chief Complaint  Patient presents with   Follow-up    Pt states still having abd pain (severe), throwing up, diarrhea for several days.   History of Present Illness  Adam Gallegos is a 22 y.o. male with a history of anxiety, asthma, ADHD, GERD, chronic diarrhea presenting today with complaint of ongoing diarrhea.  Labs in May 2024 with normal CBC, CMP, and TSH.  Seen for initial consultation 11/24/2022 by Ermalinda Memos, PA.  Patient reported a couple years of diarrhea that was recently worsened.  Having at least 3 bowel movements per day, sometimes more than this.  Denies any nocturnal stools.  Bowel movements usually occurring after meals but can also be throughout the day without certain food triggers.  Does have abdominal pain related to bowel movements but no weight loss.  Has tried antidiarrheals but caused abdominal burning as well as fiber supplementation which also has not helped.  Also having burning in the left upper quadrant associated with picking up his son.  Denies nausea or vomiting currently but did happen several months prior and a couple weeks prior.  Has had constant reflux since childhood.  Also with complaints of dysphagia with bread and steak.  Advised to check celiac labs, CRP, and sed rate and proceed with upper endoscopy with dilation.  Given dicyclomine for diarrhea and abdominal pain. Advise lactose-free diet. Started on daily PPI.  Previously reported possible family history of colon cancer but unsure who.  Denied any family history of IBD.  EGD 12/15/2022: -Esophageal mucosal changes suggestive of eosinophilic esophagitis s/p biopsy -Irregular Z-line s/p biopsy -Small hiatal hernia -Gastritis s/p  biopsy -Normal duodenum -Path: Gastric biopsy negative for H. pylori but with mild reactive changes.  Esophageal biopsy with benign mucosa but compatible with eosinophilic esophagitis.  GE junction biopsies with mild reflux changes -Advised PPI twice daily  Today:  Pizza rolls and pizza last night  he started still having cramping and abdominal pain. Vomited a lot last night and not eating well. Has been nauseas the last 24 hours. Typically bentyl helps. Right now it constantly feels like a hard abdomen. Right now hurts to eat or drink anything. Has tried but is feeling dehydrated.  Continues to feel nauseous.  Some days does not eat much at all and has poor appetite.   Has taken PPI once in a blue moon but states it caused him constipation after taking it initially after his upper endoscopy.  Urine is dark yellow. Forgot to perform prior labs ordered.   Has been fine since the EGD up until yesterday with the pizza.   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    Current Outpatient Medications  Medication Sig Dispense Refill   albuterol (PROAIR HFA) 108 (90 BASE) MCG/ACT inhaler Inhale 2 puffs into the lungs every 6 (six) hours as needed (Asthma).     albuterol (PROVENTIL) (2.5 MG/3ML) 0.083% nebulizer solution Take 2.5 mg by nebulization every 4 (four) hours as needed (Asthma).     cromolyn (OPTICROM) 4 % ophthalmic solution Place 1 drop into both eyes in the morning and at bedtime.  dicyclomine (BENTYL) 10 MG capsule Take 1 capsule (10 mg total) by mouth 4 (four) times daily -  before meals and at bedtime. 120 capsule 1   levocetirizine (XYZAL) 5 MG tablet Take 5 mg by mouth every evening.     montelukast (SINGULAIR) 10 MG tablet Take 10 mg by mouth at bedtime.     pantoprazole (PROTONIX) 40 MG tablet Take 1 tablet (40 mg total) by mouth 2 (two) times daily. 60 tablet 11   cefPROZIL  (CEFZIL) 250 MG tablet Take 250 mg by mouth daily. (Patient not taking: Reported on 01/06/2023)     chlorhexidine (HIBICLENS) 4 % external liquid Apply topically daily as needed. (Patient not taking: Reported on 12/10/2022) 120 mL 0   dicyclomine (BENTYL) 20 MG tablet Take 20 mg by mouth daily as needed for spasms (sublingual). (Patient not taking: Reported on 01/06/2023)     mupirocin ointment (BACTROBAN) 2 % Apply 1 application topically 2 (two) times daily. (Patient not taking: Reported on 12/10/2022) 22 g 0   No current facility-administered medications for this visit.    Past Medical History:  Diagnosis Date   ADD (attention deficit disorder)    ADHD (attention deficit hyperactivity disorder)    ADHD (attention deficit hyperactivity disorder)    Anxiety    Anxiety    Asthma    Autism    Autism    Hepatic steatosis    ODD (oppositional defiant disorder)    Tourette's    Tourette's disease     Past Surgical History:  Procedure Laterality Date   asthma     BALLOON DILATION N/A 12/15/2022   Procedure: BALLOON DILATION;  Surgeon: Lanelle Bal, Adam Gallegos;  [facility];  Service: Endoscopy;  Laterality: N/A;   BIOPSY  12/15/2022   Procedure: BIOPSY;  Surgeon: Lanelle Bal, Adam Gallegos;  [facility];  Service: Endoscopy;;   DENTAL SURGERY     ESOPHAGOGASTRODUODENOSCOPY (EGD) WITH PROPOFOL N/A 12/15/2022   Procedure: ESOPHAGOGASTRODUODENOSCOPY (EGD) WITH PROPOFOL;  Surgeon: Lanelle Bal, Adam Gallegos;  [facility];  Service: Endoscopy;  Laterality: N/A;  2:30 pm, asa 2   NO PAST SURGERIES      Family History  Problem Relation Age of Onset   Cancer Mother    Colon cancer Other        Think someone in his family had colon cancer, not sure who   Inflammatory bowel disease Neg Hx     Allergies as of 01/06/2023 - Review Complete 01/06/2023  Allergen Reaction Noted   Penicillins Hives 04/19/2022   Coconut fatty acids Other (See Comments) 04/19/2022   Latex Hives  04/28/2020    Social History   Socioeconomic History   Marital status: Single    Spouse name: Not on file   Number of children: Not on file   Years of education: Not on file   Highest education level: Not on file  Occupational History   Not on file  Tobacco Use   Smoking status: Never   Smokeless tobacco: Current    Types: Snuff  Vaping Use   Vaping status: Never Used  Substance and Sexual Activity   Alcohol use: No   Drug use: No   Sexual activity: Yes    Birth control/protection: Condom  Other Topics Concern   Not on file  Social History Narrative   Not on file   Social Determinants of Health   Financial Resource Strain: Not on file  Food Insecurity: Not on file  Transportation Needs: Not on file  Physical Activity: Not on file  Stress: Not on file  Social Connections: Not on file   Review of Systems   Gen: Denies fever, chills, anorexia. Denies fatigue, weakness, weight loss.  CV: Denies chest pain, palpitations, syncope, peripheral edema, and claudication. Resp: Denies dyspnea at rest, cough, wheezing, coughing up blood, and pleurisy. GI: See HPI Derm: Denies rash, itching, dry skin Psych: Denies depression, anxiety, memory loss, confusion. No homicidal or suicidal ideation.  Heme: Denies bruising, bleeding, and enlarged lymph nodes.  Physical Exam   BP 98/61   Pulse 87   Temp 98.1 F (36.7 C)   Ht 6' (1.829 m)   Wt 196 lb 12.8 oz (89.3 kg)   BMI 26.69 kg/m   General:   Alert and oriented. No distress noted. Pleasant and cooperative.  Head:  Normocephalic and atraumatic. Eyes:  Conjuctiva clear without scleral icterus. Mouth:  Oral mucosa pink and moist. Good dentition. No lesions. Lungs:  Clear to auscultation bilaterally. No wheezes, rales, or rhonchi. No distress.  Heart:  S1, S2 present without murmurs appreciated.  Abdomen:  +BS, soft, non-distended. TTP throughout. No rebound or guarding. No HSM or masses noted. Rectal: deferred Msk:   Symmetrical without gross deformities. Normal posture. Extremities:  Without edema. Neurologic:  Alert and  oriented x4 Psych:  Alert and cooperative. Normal mood and affect.  Assessment  MACALLAN PEREGRINO is a 22 y.o. male with a history of anxiety, asthma, ADHD, GERD, and chronic diarrhea presenting today with complaint of ongoing diarrhea.  GERD, eosinophilic esophagitis: Symptoms noted to be chronic.  Previously taking Tums as needed.  He was started on pantoprazole once daily at office visit in June and increase to twice daily after upper endoscopy on 7/10 revealing eosinophilic esophagitis and gastritis with biopsy confirmation of EOE and no evidence of H. pylori.  He admits to not being compliant with twice daily dosing of pantoprazole given reports of constipation secondary to this.  As noted below he has abdominal pain as well as nausea and vomiting.  Will stop pantoprazole and start omeprazole 40 mg twice daily to continue treatment of EOE as well as to help with ongoing gastritis.  N/V, abdominal pain: Previously with complaint of upper abdominal pain with activity and lifting.  Prior lab work with normal LFTs.  Previously no association to meals however since eating pizza rolls and pizza last night he has had severe upper abdominal pain followed by nausea and vomiting.  This is most likely etiology for his symptoms.  Took multiple of his dicyclomine tablets last night without relief.  Of note as stated above he has not been consistent with his pantoprazole given reports of causing constipation.  Will refill higher dose of dicyclomine given chronic upper abdominal pain however advised to use very sparingly due to side effects.  Will treat with GI cocktail and advised patient to take 1 dose as soon as he gets medication and then may take again in 6 hours and if no improvement then he should proceed to the ED. he was given Carafate to take as needed for breakthrough reflux symptoms/flares after  trial of GI cocktail.  Counseled on diet.  Also provided a short course of Zofran to use for severe nausea and vomiting.  Chronic diarrhea: Patient has had a 2-year history of chronic diarrhea that usually occurs after meals.  He did report some loose stools after eating yesterday evening but has not had any bowel movements today,  likely secondary to poor p.o. intake given his recent nausea and vomiting.  Advised to avoid all lactose products and follow low-fat diet.  He has not completed lab work previously ordered due to issues with sickness after last visit.  Re ordered celiac serologies, CRP, and sed rate to assess for etiology of diarrhea.  No mention of any celiac on recent EGD.  PLAN   Complete labs as previously ordered including celiac serologies, CRP, and sed rate Avoid lactose Stop pantoprazole and start omeprazole 40 mg twice daily.  Bentyl 20 mg once daily as needed. Refilled today.  Carafate 1g QID as needed for breakthrough symptoms after trial of GI cocktail GI cocktail once today for relief and an additional dose in 8 hours and if pain continues then proceed to ED.  Avoid all acidic foods and tomato based products. Short course of Zofran provided for nausea and vomiting Education on eosinophilic esophagitis provided today. Increase fluid intake Follow up as previously scheduled.     Brooke Bonito, MSN, FNP-BC, AGACNP-BC [facility]

## 2023-01-06 NOTE — Patient Instructions (Addendum)
Stop pantoprazole and start omeprazole 40 mg twice daily.  Take prior to breakfast and dinner.  I have sent in GI cocktail for you at the pharmacy. (Initially ordered separately but was able to order together so therefore please disregard lidocaine and Maalox prescriptions.)  I will give you a short course of Carafate to take 1 g up to 4 times a day as needed for breakthrough symptoms of reflux/abdominal pain.  This is free to keep on hand and only use as needed.  Do not take with GI cocktail.  For your nausea and vomiting I will send in a short supply of Zofran for you.  You need to avoid all tomato-based products and acidic foods/fruit juices.  I would recommend clear liquids for the rest of the day today and slowly advance her diet tomorrow.  I have refilled your Bentyl for you.  Please take no more than 40 mg in a day.  This is only written for you to take it once a day as needed.  I am given you lab slips today.  Please complete these labs as soon as possible.  Please keep your follow-up on 01/27/23.   It was a pleasure to see you today. I want to create trusting relationships with patients. If you receive a survey regarding your visit,  I greatly appreciate you taking time to fill this out on paper or through your MyChart. I value your feedback.  Brooke Bonito, MSN, FNP-BC, AGACNP-BC [facility]

## 2023-01-14 ENCOUNTER — Ambulatory Visit (INDEPENDENT_AMBULATORY_CARE_PROVIDER_SITE_OTHER): Payer: MEDICAID | Admitting: Internal Medicine

## 2023-01-14 ENCOUNTER — Other Ambulatory Visit: Payer: Self-pay

## 2023-01-14 ENCOUNTER — Encounter: Payer: Self-pay | Admitting: Internal Medicine

## 2023-01-14 VITALS — BP 120/76 | HR 128 | Temp 98.3°F | Resp 18 | Ht 70.0 in | Wt 196.2 lb

## 2023-01-14 DIAGNOSIS — J301 Allergic rhinitis due to pollen: Secondary | ICD-10-CM

## 2023-01-14 DIAGNOSIS — J4540 Moderate persistent asthma, uncomplicated: Secondary | ICD-10-CM | POA: Diagnosis not present

## 2023-01-14 DIAGNOSIS — J3081 Allergic rhinitis due to animal (cat) (dog) hair and dander: Secondary | ICD-10-CM

## 2023-01-14 DIAGNOSIS — J3089 Other allergic rhinitis: Secondary | ICD-10-CM | POA: Diagnosis not present

## 2023-01-14 MED ORDER — ASMANEX HFA 100 MCG/ACT IN AERO: 2.0000 | INHALATION_SPRAY | Freq: Two times a day (BID) | RESPIRATORY_TRACT | 5 refills | Status: DC

## 2023-01-14 MED ORDER — AEROCHAMBER PLUS FLO-VU LARGE MISC: 1.0000 | Freq: Once | 0 refills | Status: AC

## 2023-01-14 MED ORDER — MONTELUKAST SODIUM 10 MG PO TABS: 10.0000 mg | ORAL_TABLET | Freq: Every day | ORAL | 5 refills | Status: DC

## 2023-01-14 MED ORDER — ALBUTEROL SULFATE (2.5 MG/3ML) 0.083% IN NEBU: 2.5000 mg | INHALATION_SOLUTION | RESPIRATORY_TRACT | 1 refills | Status: DC | PRN

## 2023-01-14 MED ORDER — AZELASTINE HCL 0.15 % NA SOLN: NASAL | 5 refills | Status: DC

## 2023-01-14 MED ORDER — ALBUTEROL SULFATE HFA 108 (90 BASE) MCG/ACT IN AERS: 2.0000 | INHALATION_SPRAY | Freq: Four times a day (QID) | RESPIRATORY_TRACT | 1 refills | Status: DC | PRN

## 2023-01-14 MED ORDER — CETIRIZINE HCL 10 MG PO TABS: 10.0000 mg | ORAL_TABLET | Freq: Every day | ORAL | 5 refills | Status: AC

## 2023-01-14 NOTE — Addendum Note (Signed)
Addended by: Orson Aloe on: 01/14/2023 03:58 PM   Modules accepted: Orders

## 2023-01-14 NOTE — Addendum Note (Signed)
Addended by: Orson Aloe on: 01/14/2023 03:39 PM   Modules accepted: Orders

## 2023-01-14 NOTE — Progress Notes (Signed)
NEW PATIENT  Date of Service/Encounter:  01/14/23  Consult requested by: Dion Saucier, PA-C   Subjective:   Adam Gallegos (DOB: Apr 09, 2001) is a 22 y.o. male who presents to the clinic on 01/14/2023 with a chief complaint of Asthma (No issues ) .    History obtained from: chart review and patient.   Asthma:  Diagnosed at around age 12-2.  He seems to flare up with weather and temperature changes.   He works in the [employer] and does not have any issues with smoke exposure. He had to undergo extensive testing and wishes a note that says his "asthma will not cause any issues with his dream job."He is having almost daily symptoms with SOB requiring albuterol almost every day. daily daytime symptoms in past month, not sure nighttime awakenings in past month Using rescue inhaler: using it almost daily  Limitations to daily activity: none 0 ED visits/UC visits and 0 oral steroids in the past year 0 number of lifetime hospitalizations, 0 number of lifetime intubations.  Identified Triggers:  temperature changes  Prior PFTs or spirometry: none available for review  Current regimen:  Maintenance: Montelukast, previously tried Flovent? (Orange inhaler) Rescue: Albuterol 2 puffs q4-6 hrs PRN  Rhinitis:  Started in childhood.  Symptoms include: nasal congestion, rhinorrhea, and post nasal drainage  Occurs year-round Potential triggers: not sure   Treatments tried:  Xyzal daily; Last use was yesterday. Not sure if it is helping.  Steroid nose sprays cause nosebleeds  Previous allergy testing: no History of sinus surgery: no Nonallergic triggers: none   Of note, he does have EoE and GERD and is followed by GI.   Past Medical History: Past Medical History:  Diagnosis Date   ADD (attention deficit disorder)    ADHD (attention deficit hyperactivity disorder)    ADHD (attention deficit hyperactivity disorder)    Anxiety    Anxiety    Asthma    Autism    Autism     Hepatic steatosis    ODD (oppositional defiant disorder)    Tourette's    Tourette's disease     Past Surgical History: Past Surgical History:  Procedure Laterality Date   asthma     BALLOON DILATION N/A 12/15/2022   Procedure: BALLOON DILATION;  Surgeon: Lanelle Bal, DO;  [facility];  Service: Endoscopy;  Laterality: N/A;   BIOPSY  12/15/2022   Procedure: BIOPSY;  Surgeon: Lanelle Bal, DO;  [facility];  Service: Endoscopy;;   DENTAL SURGERY     ESOPHAGOGASTRODUODENOSCOPY (EGD) WITH PROPOFOL N/A 12/15/2022   Procedure: ESOPHAGOGASTRODUODENOSCOPY (EGD) WITH PROPOFOL;  Surgeon: Lanelle Bal, DO;  [facility];  Service: Endoscopy;  Laterality: N/A;  2:30 pm, asa 2   NO PAST SURGERIES      Family History: Family History  Problem Relation Age of Onset   Cancer Mother    Colon cancer Other        Think someone in his family had colon cancer, not sure who   Inflammatory bowel disease Neg Hx      Medication List:  Allergies as of 01/14/2023       Reactions   Penicillins Hives   Coconut Fatty Acids Other (See Comments)   I swell up"   Latex Hives        Medication List        Accurate as of January 14, 2023  3:32 PM. If you have any questions, ask  your nurse or doctor.          cefPROZIL 250 MG tablet Commonly known as: CEFZIL Take 250 mg by mouth daily.   chlorhexidine 4 % external liquid Commonly known as: Hibiclens Apply topically daily as needed.   cromolyn 4 % ophthalmic solution Commonly known as: OPTICROM Place 1 drop into both eyes in the morning and at bedtime.   dicyclomine 20 MG tablet Commonly known as: BENTYL Take 1 tablet (20 mg total) by mouth daily as needed for spasms (sublingual).   GI Cocktail (alum & mag hydroxide-simethicone/lidocaine)oral mixture Take 30 mL by mouth once and then repeat in 6 hours if needed.   levocetirizine 5 MG tablet Commonly known as: XYZAL Take 5 mg by mouth  every evening.   lidocaine 2 % solution Commonly known as: XYLOCAINE SMARTSIG:By Mouth   montelukast 10 MG tablet Commonly known as: SINGULAIR Take 10 mg by mouth at bedtime.   mupirocin ointment 2 % Commonly known as: BACTROBAN Apply 1 application topically 2 (two) times daily.   omeprazole 40 MG capsule Commonly known as: PRILOSEC Take 1 capsule (40 mg total) by mouth 2 (two) times daily.   ondansetron 4 MG disintegrating tablet Commonly known as: ZOFRAN-ODT Take 1 tablet (4 mg total) by mouth every 8 (eight) hours as needed for nausea or vomiting.   ProAir HFA 108 (90 Base) MCG/ACT inhaler Generic drug: albuterol Inhale 2 puffs into the lungs every 6 (six) hours as needed (Asthma).   albuterol (2.5 MG/3ML) 0.083% nebulizer solution Commonly known as: PROVENTIL Take 2.5 mg by nebulization every 4 (four) hours as needed (Asthma).   sucralfate 1 g tablet Commonly known as: Carafate Take 1 tablet (1 g total) by mouth 4 (four) times daily -  with meals and at bedtime for 14 days.         REVIEW OF SYSTEMS: Pertinent positives and negatives discussed in HPI.   Objective:   Physical Exam: BP 120/76   Pulse (!) 128   Temp 98.3 F (36.8 C)   Resp 18   Ht 5\' 10"  (1.778 m)   Wt 196 lb 3.2 oz (89 kg)   SpO2 97%   BMI 28.15 kg/m  Body mass index is 28.15 kg/m. GEN: alert, well developed HEENT: clear conjunctiva, TM grey and translucent, nose with + inferior turbinate hypertrophy, pink nasal mucosa, slight clear rhinorrhea, + cobblestoning HEART: regular rate and rhythm, no murmur LUNGS: clear to auscultation bilaterally, no coughing, unlabored respiration ABDOMEN: soft, non distended  SKIN: no rashes or lesions  Reviewed:  01/06/2023: seen by Boris Lown NP GI for chronic diarrhea, GERD.  EGD showed eosinophilic esophagitis. Started on Omeprazole 40mg  BID, Bentyl PRN, Carafate PRN.  12/15/2022: EGD with Mucosal changes including feline appearance, longitudinal furrows  and white plaques were found in the middle third of the esophagus.  Biopsy showed mid esophagus with 40 eosinophils per HPF.   02/18/2020: seen in urgent care for cough, wheezing. Started on steroid and albuterol PRN.   Spirometry:  Tracings reviewed. His effort: Good reproducible efforts. FVC: 4.93L, post 5.31L FEV1: 3.28L, 71% predicted; post 3.70, 80% with 13% change  FEV1/FVC ratio: 67% Interpretation: Spirometry consistent with mild obstructive disease.  Please see scanned spirometry results for details.  Skin Testing:  Skin prick testing was placed, which includes aeroallergens/foods, histamine control, and saline control.  Verbal consent was obtained prior to placing test.  Patient tolerated procedure well.  Allergy testing results were read and interpreted by myself, documented by  clinical staff. Adequate positive and negative control.  Results discussed with patient/family.  Airborne Adult Perc - 01/14/23 1454     Time Antigen Placed 1454    Allergen Manufacturer Waynette Buttery    Location Back    Number of Test 55    1. Control-Buffer 50% Glycerol Negative    2. Control-Histamine 3+    3. Bahia Negative    4. [place] 3+    5. Johnson 3+    6. Kentucky Blue 3+    7. Meadow Fescue 3+    8. Perennial Rye Negative    9. Timothy 3+    10. Ragweed Mix Negative    11. Cocklebur Negative    12. Plantain,  English Negative    13. Baccharis 2+    14. Dog Fennel Negative    15. Russian Thistle Negative    16. Lamb's Quarters Negative    17. Sheep Sorrell 3+    18. Rough Pigweed Negative    19. Marsh Elder, Rough 2+    20. Mugwort, Common Negative    21. Box, Elder Negative    22. Cedar, red Negative    23. Sweet Gum Negative    24. Pecan Pollen Negative    25. Pine Mix Negative    26. Walnut, Black Pollen Negative    27. Red Mulberry 2+    28. Ash Mix Negative    29. Birch Mix Negative    30. Beech American 2+    31. Cottonwood, Guinea-Bissau Negative    32. Hickory, White  Negative    33. Maple Mix Negative    34. Oak, Guinea[place] Mix Negative    35. Sycamore Eastern Negative    36. Alternaria Alternata 3+    37. Cladosporium Herbarum 3+    38. Aspergillus Mix 2+    39. Penicillium Mix 2+    40. Bipolaris Sorokiniana (Helminthosporium) Negative    41. Drechslera Spicifera (Curvularia) Negative    42. Mucor Plumbeus Negative    43. Fusarium Moniliforme Negative    44. Aureobasidium Pullulans (pullulara) Negative    45. Rhizopus Oryzae Negative    46. Botrytis Cinera 2+    47. Epicoccum Nigrum 2+    48. Phoma Betae Omitted    49. Dust Mite Mix 3+    50. Cat Hair 10,000 BAU/ml 2+    51.  Dog Epithelia Negative    52. Mixed Feathers Negative    53. Horse Epithelia 3+    54. Cockroach, German 2+    55. Tobacco Leaf Negative               Assessment:   1. Moderate persistent asthma without complication   2. Non-seasonal allergic rhinitis due to pollen   3. Allergic rhinitis caused by mold   4. Allergic rhinitis due to animal hair or dander   5. Allergic rhinitis due to dust mite   6. Allergic rhinitis due to insect     Plan/Recommendations:  Moderate Persistent Asthma - MDI technique discussed.  Uncontrolled with daily albuterol use and spirometry today with reversible obstruction.  Will start ICS for control.  - Also discussed at length that we cannot write a letter stating his asthma would not affect his job. At most, we can discuss the type of asthma he has, use of medications and whether it is controlled or not.   - Maintenance inhaler: Continue Singulair 10mg  daily.  Start Asmanex 2 puffs twice daily with spacer.  If this is not  covered, please ask pharmacy what inhaler is so we can prescribe that.   - Rescue inhaler: Albuterol 2 puffs via spacer or 1 vial via nebulizer every 4-6 hours as needed for respiratory symptoms of cough, shortness of breath, or wheezing Asthma control goals:  Full participation in all desired activities  (may need albuterol before activity) Albuterol use two times or less a week on average (not counting use with activity) Cough interfering with sleep two times or less a month Oral steroids no more than once a year No hospitalizations   Allergic Rhinitis: - Due to turbinate hypertrophy and unresponsive to over the counter meds, performed skin testing to identify aeroallergen triggers.   - Positive skin test 01/2023: trees, grasses, weeds, molds, dust mite, cats, cockroach, horses  - Avoidance measures discussed. - Use nasal saline rinses before nose sprays such as with Neilmed Sinus Rinse.  Use distilled water.   - Use Azelastine 1-2 sprays each nostril twice daily as needed for runny nose, drainage, sneezing, congestion. Aim upward and outward. - Use Zyrtec 10 mg daily or Allegra 180mg  daily.  - Use Singulair 10mg  daily.  Stop if there are any mood/behavioral changes. - Consider allergy shots as long term control of your symptoms by teaching your immune system to be more tolerant of your allergy triggers.   Eosinophilic Esophagitis GERD - Followed by GI. Continue Omeprazole 40mg  twice daily.      Return in about 6 weeks (around 02/25/2023).  Alesia Morin, MD [facility]

## 2023-01-14 NOTE — Patient Instructions (Addendum)
Moderate Persistent Asthma - Maintenance inhaler: Continue Singulair 10mg  daily.  Start Asmanex 2 puffs twice daily with spacer.  If this is not covered, please ask pharmacy what inhaler is so we can prescribe that.   - Rescue inhaler: Albuterol 2 puffs via spacer or 1 vial via nebulizer every 4-6 hours as needed for respiratory symptoms of cough, shortness of breath, or wheezing Asthma control goals:  Full participation in all desired activities (may need albuterol before activity) Albuterol use two times or less a week on average (not counting use with activity) Cough interfering with sleep two times or less a month Oral steroids no more than once a year No hospitalizations    Allergic Rhinitis: - Positive skin test 01/2023: trees, grasses, weeds, molds, dust mite, cats, cockroach, horses  - Avoidance measures discussed. - Use nasal saline rinses before nose sprays such as with Neilmed Sinus Rinse.  Use distilled water.   - Use Azelastine 1-2 sprays each nostril twice daily as needed for runny nose, drainage, sneezing, congestion. Aim upward and outward. - Use Zyrtec 10 mg daily or Allegra 180mg  daily.  - Use Singulair 10mg  daily.  Stop if there are any mood/behavioral changes. - Consider allergy shots as long term control of your symptoms by teaching your immune system to be more tolerant of your allergy triggers.   Eosinophilic Esophagitis GERD - Followed by GI. Continue Omeprazole 40mg  twice daily.

## 2023-01-17 NOTE — Addendum Note (Signed)
Addended by: Elsworth Soho on: 01/17/2023 05:25 PM   Modules accepted: Orders

## 2023-01-26 ENCOUNTER — Ambulatory Visit: Payer: MEDICAID | Admitting: Gastroenterology

## 2023-01-26 NOTE — Progress Notes (Signed)
Referring Provider: Antonietta Jewel Primary Care Physician:  Antonietta Jewel Primary GI Physician: Dr. Marletta Lor  Chief Complaint  Patient presents with   Follow-up    Still having issues, states that none of the medication is working    HPI:   Adam Gallegos is a 22 y.o. male with history of asthma, anxiety, ADHD, Tourette's, GERD, EOE, chronic diarrhea, presenting today for follow-up.   EGD 12/15/2022 with mucosal changes suggestive of EOE and biopsies confirming this along with mild reflux changes in the GE junction.  Also with small hiatal hernia, gastritis with biopsies negative for H. pylori.  PPI was increased to twice daily at that time.  Last seen in the office 01/06/2023.  Reported eating pizza rolls and pizza the night prior to office visit and started having abdominal cramping and pain along with vomiting.  Reported ongoing nausea, constant hard abdomen, and now having pain with eating or drinking anything.  Noted some days he was not eating much and had a poor appetite.   Urine was dark. He was only taking pantoprazole once in a blue moon as he reported it caused constipation.  Noted he had been doing fine since his EGD until yesterday when eating pizza.  He was advised to stop pantoprazole, start omeprazole 40 mg twice daily, prescribed GI cocktail Carafate, Zofran for acute symptoms.  Also recommended completing prior lab work that has been ordered for diarrhea including CRP, sed rate, celiac serologies.  Bentyl was also increased to 20 mg daily as needed.  Labs have not been completed.  Today:  Reports having another episode last week of acute onset severe mid to upper abdominal pain described as pulling, twisting, bundling together, with associated nausea and vomiting. Tried dicyclomine, but this didn't help.  Motrin helps somewhat.  Tried significant others muscle relaxer and this helped significantly.  This was his first episode since his last office visit on 8/1.   Prior to this episode, he had eaten sausage, egg, cheese biscuit.  Reports between episodes, he feels completely fine.  Reports with medications, symptoms will go away in about an hour.  Without medications, symptoms will last for a few hours.  Reports he is taken his acid reflux medication when needed.  In general, he does not have reflux on a regular basis.  He thinks he is taking pantoprazole, not omeprazole.  No real issues with swallowing at this time.  Reports having labs completed with PCP couple days ago.  States diarrhea is not as bad as it used to be.  Will has about 3-4 bowel movements a day depending on what and how much he eats.  No BRBPR, melena, nocturnal stools.  No abdominal pain related to bowel movements. Hasn't had labs completed.    Past Medical History:  Diagnosis Date   ADD (attention deficit disorder)    ADHD (attention deficit hyperactivity disorder)    ADHD (attention deficit hyperactivity disorder)    Anxiety    Anxiety    Asthma    Autism    Autism    Hepatic steatosis    ODD (oppositional defiant disorder)    Tourette's    Tourette's disease     Past Surgical History:  Procedure Laterality Date   BALLOON DILATION N/A 12/15/2022   Procedure: BALLOON DILATION;  Surgeon: Lanelle Bal, DO;  [facility];  Service: Endoscopy;  Laterality: N/A;   BIOPSY  12/15/2022   Procedure: BIOPSY;  Surgeon: Earnest Bailey  K, DO;  [facility];  Service: Endoscopy;;   DENTAL SURGERY     ESOPHAGOGASTRODUODENOSCOPY (EGD) WITH PROPOFOL N/A 12/15/2022   Procedure: ESOPHAGOGASTRODUODENOSCOPY (EGD) WITH PROPOFOL;  Surgeon: Lanelle Bal, DO;  [facility];  Service: Endoscopy;  Laterality: N/A;  2:30 pm, asa 2    Current Outpatient Medications  Medication Sig Dispense Refill   albuterol (PROAIR HFA) 108 (90 Base) MCG/ACT inhaler Inhale 2 puffs into the lungs every 6 (six) hours as needed (Asthma). 18 g 1   albuterol (PROVENTIL)  (2.5 MG/3ML) 0.083% nebulizer solution Take 3 mLs (2.5 mg total) by nebulization every 4 (four) hours as needed for shortness of breath or wheezing. 75 mL 1   cetirizine (ZYRTEC ALLERGY) 10 MG tablet Take 1 tablet (10 mg total) by mouth daily. 30 tablet 5   cromolyn (OPTICROM) 4 % ophthalmic solution Place 1 drop into both eyes in the morning and at bedtime.     dicyclomine (BENTYL) 20 MG tablet Take 1 tablet (20 mg total) by mouth daily as needed for spasms (sublingual). 20 tablet 1   levocetirizine (XYZAL) 5 MG tablet Take 5 mg by mouth every evening.     Mometasone Furoate (ASMANEX HFA) 100 MCG/ACT AERO Inhale 2 puffs into the lungs in the morning and at bedtime. 13 g 5   montelukast (SINGULAIR) 10 MG tablet Take 1 tablet (10 mg total) by mouth at bedtime. 30 tablet 5   omeprazole (PRILOSEC) 40 MG capsule Take 1 capsule (40 mg total) by mouth 2 (two) times daily. 90 capsule 3   ondansetron (ZOFRAN-ODT) 4 MG disintegrating tablet Take 1 tablet (4 mg total) by mouth every 8 (eight) hours as needed for nausea or vomiting. 20 tablet 0   sucralfate (CARAFATE) 1 g tablet Take 1 tablet (1 g total) by mouth 4 (four) times daily -  with meals and at bedtime for 14 days. 56 tablet 0   No current facility-administered medications for this visit.    Allergies as of 01/27/2023 - Review Complete 01/27/2023  Allergen Reaction Noted   Penicillins Hives 04/19/2022   Coconut fatty acids Other (See Comments) 04/19/2022   Latex Hives 04/28/2020    Family History  Problem Relation Age of Onset   Cancer Mother    Colon cancer Other        Think someone in his family had colon cancer, not sure who   Inflammatory bowel disease Neg Hx     Social History   Socioeconomic History   Marital status: Single    Spouse name: Not on file   Number of children: Not on file   Years of education: Not on file   Highest education level: Not on file  Occupational History   Not on file  Tobacco Use   Smoking  status: Never   Smokeless tobacco: Current    Types: Snuff  Vaping Use   Vaping status: Never Used  Substance and Sexual Activity   Alcohol use: No   Drug use: No   Sexual activity: Yes    Birth control/protection: Condom  Other Topics Concern   Not on file  Social History Narrative   Not on file   Social Determinants of Health   Financial Resource Strain: Not on file  Food Insecurity: Not on file  Transportation Needs: Not on file  Physical Activity: Not on file  Stress: Not on file  Social Connections: Not on file    Review of Systems: Gen: Denies fever,  chills, cold or flulike symptoms, presyncope, syncope. CV: Denies chest pain, palpitations. Resp: Denies dyspnea, cough. GI: See HPI Heme: See HPI  Physical Exam: BP 114/76 (BP Location: Left Arm, Patient Position: Sitting, Cuff Size: Normal)   Pulse (!) 101   Temp 98.7 F (37.1 C) (Oral)   Ht 6' (1.829 m)   Wt 202 lb 12.8 oz (92 kg)   SpO2 97%   BMI 27.50 kg/m  General:   Alert and oriented. No distress noted. Pleasant and cooperative.  Head:  Normocephalic and atraumatic. Eyes:  Conjuctiva clear without scleral icterus. Heart:  S1, S2 present without murmurs appreciated. Lungs:  Clear to auscultation bilaterally. No wheezes, rales, or rhonchi. No distress.  Abdomen:  +BS, soft, and non-distended.  Mild TTP across the upper abdomen.  No rebound or guarding. No HSM or masses noted. Msk:  Symmetrical without gross deformities. Normal posture. Extremities:  Without edema. Neurologic:  Alert and  oriented x4 Psych:  Normal mood and affect.    Assessment:  22 year old male with history of asthma, anxiety, ADHD, Tourette's, GERD, EOE, chronic diarrhea, presenting today for follow-up with chief complaint of recurrent upper abdominal pain.  Upper abdominal pain: Intermittent episodes of upper abdominal pain with associated nausea and vomiting, often following consumption of greasy food.  EGD in July 2024 to  evaluate the same symptoms revealed EOE, mild reflux changes at GE junction, small hiatal hernia, gastritis with biopsies negative for H. pylori.  He was advised to take PPI twice daily, but has only been taking this as needed as he reports reflux symptoms are fairly infrequent.  His intermittent spells of epigastric abdominal pain, nausea, vomiting could be related to GERD/gastritis, not taking PPI as prescribed and poor dietary choices, but unable to rule out biliary etiology.  Recommended RUQ ultrasound for further evaluation, resuming PPI BID, and low fat diet.  I am also requesting recent labs from PCP as patient reports having blood work done within the last couple of days.  EOE/dysphagia: Previously reporting dysphagia and found to have eosinophilic esophagitis on EGD 12/15/2022.  No dilation was performed.  He was to start PPI twice daily at that time, but has only been taking as needed.  Clinically, he reports dysphagia has improved.  Discussed EOE with him today and explained that he needed to be on high-dose PPI therapy for at least 8 weeks followed by repeat EGD to confirm histologic improvement.  Chronic diarrhea: Reports symptoms seemed to have improved on their on.  Only taking dicyclomine as needed.  Still having 3-4 bowel movements daily, but depends on what he eats and how much.  No alarm symptoms.  Still has not had labs completed that were previously ordered.    Plan: RUQ [place] Complete labs to evaluate diarrhea as previously ordered (CRP, sed rate, celiac labs) Request recent labs from PCP for review. Take PPI twice daily 30 minutes before breakfast and dinner.  Requested patient to double check his medication and let us know if he is taking pantoprazole 40 mg or omeprazole 40 mg as both are listed on his medication list. Recommended following a strict low-fat diet with lean meats that are baked, boiled, broiled, grilled.  Also recommended avoiding spicy foods. Continue dicyclomine as  needed. Will need repeat EGD in about 8 weeks to re-evaluate EOE.  Further recommendations to follow [place] and labs.   Ermalinda Memos, PA-C [facility] 01/27/2023

## 2023-01-27 ENCOUNTER — Telehealth: Payer: Self-pay | Admitting: *Deleted

## 2023-01-27 ENCOUNTER — Encounter: Payer: Self-pay | Admitting: Gastroenterology

## 2023-01-27 ENCOUNTER — Ambulatory Visit (INDEPENDENT_AMBULATORY_CARE_PROVIDER_SITE_OTHER): Payer: MEDICAID | Admitting: Gastroenterology

## 2023-01-27 VITALS — BP 114/76 | HR 101 | Temp 98.7°F | Ht 72.0 in | Wt 202.8 lb

## 2023-01-27 DIAGNOSIS — R1010 Upper abdominal pain, unspecified: Secondary | ICD-10-CM

## 2023-01-27 DIAGNOSIS — R1310 Dysphagia, unspecified: Secondary | ICD-10-CM

## 2023-01-27 DIAGNOSIS — R112 Nausea with vomiting, unspecified: Secondary | ICD-10-CM

## 2023-01-27 DIAGNOSIS — K529 Noninfective gastroenteritis and colitis, unspecified: Secondary | ICD-10-CM | POA: Diagnosis not present

## 2023-01-27 DIAGNOSIS — K219 Gastro-esophageal reflux disease without esophagitis: Secondary | ICD-10-CM | POA: Diagnosis not present

## 2023-01-27 NOTE — Patient Instructions (Addendum)
We will arrange for you to have an ultrasound of your gallbladder at [facility].  Please have your blood work completed the same day that you have your ultrasound.  I will request her recent blood work from your primary care provider for review as well.  Continue pantoprazole 40 mg twice daily 30 minutes before breakfast and dinner.  Please double check your medication when you get home and confirm that you are taking pantoprazole, not omeprazole and let me know.  Follow a bland diet.  All meats should be lean including poultry or fish that are baked, boiled, broth, or grilled. No fried, fatty, greasy foods. Avoid spicy foods as well.  You may continue to use dicyclomine as needed.  We will have further recommendations for you following your ultrasound.  Ermalinda Memos, PA-C [facility]

## 2023-01-27 NOTE — Telephone Encounter (Signed)
LMOVM to call back to give [place] appt details 8/30, arrival 745am, npo midnight

## 2023-01-28 ENCOUNTER — Encounter: Payer: Self-pay | Admitting: Gastroenterology

## 2023-01-28 NOTE — Telephone Encounter (Signed)
Pt aware of appt details 

## 2023-02-04 ENCOUNTER — Ambulatory Visit (HOSPITAL_COMMUNITY)
Admission: RE | Admit: 2023-02-04 | Discharge: 2023-02-04 | Disposition: A | Discharge status: Home / Self Care | Payer: MEDICAID | Source: Ambulatory Visit | Attending: Gastroenterology | Admitting: Gastroenterology

## 2023-02-04 ENCOUNTER — Other Ambulatory Visit (HOSPITAL_COMMUNITY)
Admission: RE | Admit: 2023-02-04 | Discharge: 2023-02-04 | Disposition: A | Discharge status: Home / Self Care | Payer: MEDICAID | Source: Ambulatory Visit | Attending: Gastroenterology | Admitting: Gastroenterology

## 2023-02-04 DIAGNOSIS — R1010 Upper abdominal pain, unspecified: Secondary | ICD-10-CM | POA: Diagnosis present

## 2023-02-04 DIAGNOSIS — R112 Nausea with vomiting, unspecified: Secondary | ICD-10-CM | POA: Diagnosis present

## 2023-02-04 LAB — C-REACTIVE PROTEIN: CRP: 1.4 mg/dL — ABNORMAL HIGH (ref <1.0)

## 2023-02-04 LAB — SEDIMENTATION RATE: Sed Rate: 5 mm/hr (ref 0–16)

## 2023-02-05 LAB — IGA: IgA: 194 mg/dL (ref 90–386)

## 2023-02-08 ENCOUNTER — Other Ambulatory Visit: Payer: Self-pay | Admitting: *Deleted

## 2023-02-08 DIAGNOSIS — R1010 Upper abdominal pain, unspecified: Secondary | ICD-10-CM

## 2023-02-08 DIAGNOSIS — R112 Nausea with vomiting, unspecified: Secondary | ICD-10-CM

## 2023-02-08 LAB — GLIA (IGA/G) + TTG IGA
Antigliadin Abs, IgA: 6 U (ref 0–19)
Gliadin IgG: 3 U (ref 0–19)
Tissue Transglutaminase Ab, IgA: <2 U/mL (ref 0–3)

## 2023-02-11 ENCOUNTER — Encounter (HOSPITAL_COMMUNITY)
Admission: RE | Admit: 2023-02-11 | Discharge: 2023-02-11 | Disposition: A | Discharge status: Home / Self Care | Payer: MEDICAID | Source: Ambulatory Visit | Attending: Gastroenterology | Admitting: Gastroenterology

## 2023-02-11 DIAGNOSIS — R1010 Upper abdominal pain, unspecified: Secondary | ICD-10-CM | POA: Insufficient documentation

## 2023-02-11 DIAGNOSIS — R112 Nausea with vomiting, unspecified: Secondary | ICD-10-CM | POA: Insufficient documentation

## 2023-02-14 ENCOUNTER — Ambulatory Visit: Payer: MEDICAID | Admitting: Urology

## 2023-02-18 ENCOUNTER — Encounter (HOSPITAL_COMMUNITY): Payer: Self-pay

## 2023-02-18 ENCOUNTER — Encounter (HOSPITAL_COMMUNITY)
Admission: RE | Admit: 2023-02-18 | Discharge: 2023-02-18 | Disposition: A | Discharge status: Home / Self Care | Payer: MEDICAID | Source: Ambulatory Visit | Attending: Gastroenterology

## 2023-02-18 VITALS — BP 119/67 | HR 80 | Temp 98.0°F | Resp 18

## 2023-02-18 DIAGNOSIS — R112 Nausea with vomiting, unspecified: Secondary | ICD-10-CM | POA: Insufficient documentation

## 2023-02-18 DIAGNOSIS — R1010 Upper abdominal pain, unspecified: Secondary | ICD-10-CM | POA: Diagnosis present

## 2023-02-18 MED ORDER — TECHNETIUM TC 99M MEBROFENIN IV KIT
5.0000 | PACK | Freq: Once | INTRAVENOUS | Status: AC | PRN
  Administered 2023-02-18: 4.9 via INTRAVENOUS

## 2023-02-18 MED ORDER — MORPHINE SULFATE (PF) 4 MG/ML IV SOLN
3.0000 mg | Freq: Once | INTRAVENOUS | Status: AC
  Filled 2023-02-18: qty 0.8

## 2023-02-18 MED ORDER — MORPHINE SULFATE (PF) 4 MG/ML IV SOLN
INTRAVENOUS | Status: AC
  Administered 2023-02-18: 3 mg via INTRAVENOUS
  Filled 2023-02-18: qty 1

## 2023-02-21 ENCOUNTER — Telehealth: Payer: Self-pay | Admitting: *Deleted

## 2023-02-21 ENCOUNTER — Ambulatory Visit (INDEPENDENT_AMBULATORY_CARE_PROVIDER_SITE_OTHER): Payer: MEDICAID | Admitting: Urology

## 2023-02-21 ENCOUNTER — Other Ambulatory Visit: Payer: Self-pay | Admitting: Gastroenterology

## 2023-02-21 ENCOUNTER — Encounter: Payer: Self-pay | Admitting: Urology

## 2023-02-21 VITALS — BP 120/67 | HR 101

## 2023-02-21 DIAGNOSIS — N529 Male erectile dysfunction, unspecified: Secondary | ICD-10-CM | POA: Diagnosis not present

## 2023-02-21 DIAGNOSIS — E291 Testicular hypofunction: Secondary | ICD-10-CM

## 2023-02-21 DIAGNOSIS — N62 Hypertrophy of breast: Secondary | ICD-10-CM

## 2023-02-21 DIAGNOSIS — R948 Abnormal results of function studies of other organs and systems: Secondary | ICD-10-CM

## 2023-02-21 DIAGNOSIS — R1010 Upper abdominal pain, unspecified: Secondary | ICD-10-CM

## 2023-02-21 DIAGNOSIS — R7989 Other specified abnormal findings of blood chemistry: Secondary | ICD-10-CM

## 2023-02-21 MED ORDER — SILDENAFIL CITRATE 20 MG PO TABS: ORAL_TABLET | ORAL | 11 refills | Status: AC

## 2023-02-21 NOTE — Telephone Encounter (Signed)
error 

## 2023-02-21 NOTE — Progress Notes (Unsigned)
02/21/2023 9:58 AM   Adam Gallegos February 06, 2001 [id number]  Referring provider: Dion Saucier, PA-C [address] [place],  [place] [address]  No chief complaint on file.   HPI:  New pt -   1) ED - he has issues getting and maintaining erection. He tried a "blue chew" that contained vardenifil which helped but was expensive. ED for as long as he can remember.   2) low T - He has been on ADHD meds and steroids for asthma. He had somewhat delayed puberty. They stopped meds and he hit puberty at age 70. He also c/o low libido and fatigue. He is tired during the day. He feels like he is gaining weight. Can't lose weight. Has gynecomastia and more of a male body type - "gains weight in hips". Minimal fluid when he ejaculates. He feels like his testicle are small. His Aug 2024 T was 311, free T 75. He would like to have kids.   Here with his significant other. He has no kids. She has a 73 mo old.   He is studying for EMT.     PMH: Past Medical History:  Diagnosis Date   ADD (attention deficit disorder)    ADHD (attention deficit hyperactivity disorder)    ADHD (attention deficit hyperactivity disorder)    Anxiety    Anxiety    Asthma    Autism    Autism    Hepatic steatosis    ODD (oppositional defiant disorder)    Tourette's    Tourette's disease     Surgical History: Past Surgical History:  Procedure Laterality Date   BALLOON DILATION N/A 12/15/2022   Procedure: BALLOON DILATION;  Surgeon: Lanelle Bal, DO;  [facility];  Service: Endoscopy;  Laterality: N/A;   BIOPSY  12/15/2022   Procedure: BIOPSY;  Surgeon: Lanelle Bal, DO;  [facility];  Service: Endoscopy;;   DENTAL SURGERY     ESOPHAGOGASTRODUODENOSCOPY (EGD) WITH PROPOFOL N/A 12/15/2022   Procedure: ESOPHAGOGASTRODUODENOSCOPY (EGD) WITH PROPOFOL;  Surgeon: Lanelle Bal, DO;  [facility];  Service: Endoscopy;  Laterality: N/A;  2:30 pm, asa 2    Home  Medications:  Allergies as of 02/21/2023       Reactions   Penicillins Hives   Coconut Fatty Acid Other (See Comments)   I swell up"   Latex Hives        Medication List        Accurate as of February 21, 2023  9:58 AM. If you have any questions, ask your nurse or doctor.          albuterol 108 (90 Base) MCG/ACT inhaler Commonly known as: ProAir HFA Inhale 2 puffs into the lungs every 6 (six) hours as needed (Asthma).   albuterol (2.5 MG/3ML) 0.083% nebulizer solution Commonly known as: PROVENTIL Take 3 mLs (2.5 mg total) by nebulization every 4 (four) hours as needed for shortness of breath or wheezing.   Asmanex HFA 100 MCG/ACT Aero Generic drug: Mometasone Furoate Inhale 2 puffs into the lungs in the morning and at bedtime.   cetirizine 10 MG tablet Commonly known as: ZyrTEC Allergy Take 1 tablet (10 mg total) by mouth daily.   cromolyn 4 % ophthalmic solution Commonly known as: OPTICROM Place 1 drop into both eyes in the morning and at bedtime.   dicyclomine 20 MG tablet Commonly known as: BENTYL Take 1 tablet (20 mg total) by mouth daily as needed for spasms (  sublingual).   levocetirizine 5 MG tablet Commonly known as: XYZAL Take 5 mg by mouth every evening.   montelukast 10 MG tablet Commonly known as: SINGULAIR Take 1 tablet (10 mg total) by mouth at bedtime.   omeprazole 40 MG capsule Commonly known as: PRILOSEC Take 1 capsule (40 mg total) by mouth 2 (two) times daily.   ondansetron 4 MG disintegrating tablet Commonly known as: ZOFRAN-ODT Take 1 tablet (4 mg total) by mouth every 8 (eight) hours as needed for nausea or vomiting.   sucralfate 1 g tablet Commonly known as: Carafate Take 1 tablet (1 g total) by mouth 4 (four) times daily -  with meals and at bedtime for 14 days.        Allergies:  Allergies  Allergen Reactions   Penicillins Hives   Coconut Fatty Acid Other (See Comments)    I swell up"   Latex Hives    Family  History: Family History  Problem Relation Age of Onset   Cancer Mother    Colon cancer Other        Think someone in his family had colon cancer, not sure who   Inflammatory bowel disease Neg Hx     Social History:  reports that he has never smoked. His smokeless tobacco use includes snuff. He reports that he does not drink alcohol and does not use drugs.   Physical Exam: BP 120/67   Pulse (!) 101   Constitutional:  Alert and oriented, No acute distress. HEENT: [place] AT, moist mucus membranes.  Trachea midline, no masses. Cardiovascular: No clubbing, cyanosis, or edema. Chest: + gynecomastia. No palpable nodule.  Respiratory: Normal respiratory effort, no increased work of breathing. GI: Abdomen is soft, nontender, nondistended, no abdominal masses GU: No CVA tenderness Lymph: No cervical or inguinal lymphadenopathy.  Skin: No rashes, bruises or suspicious lesions. Neurologic: Grossly intact, no focal deficits, moving all 4 extremities. Psychiatric: Normal mood and affect. GU: Penis circumcised, normal foreskin, testicles descended bilaterally and palpably normal, bilateral epididymis palpably normal, scrotum normal    Laboratory Data: No results found for: "WBC", "HGB", "HCT", "MCV", "PLT"  No results found for: "CREATININE"  No results found for: "PSA"  No results found for: "TESTOSTERONE"  No results found for: "HGBA1C"  Urinalysis No results found for: "COLORURINE", "APPEARANCEUR", "LABSPEC", "PHURINE", "GLUCOSEU", "HGBUR", "BILIRUBINUR", "KETONESUR", "PROTEINUR", "UROBILINOGEN", "NITRITE", "LEUKOCYTESUR"  No results found for: "LABMICR", "WBCUA", "RBCUA", "LABEPIT", "MUCUS", "BACTERIA"  Pertinent Imaging:  Assessment & Plan:    1. Erectile dysfunction, unspecified erectile dysfunction type -  disc nature r/b/a to pde5i. He will start sildenafil.   2. Low T - Check T and then confirmatory labs if needed - would need prolac, FSH/LH and estradiol. Disc risk of T  manipulation - DVT/PE and MACe among others. Also consider karyotype.   3. Gynecomastia - disc referral to Gen surg to consider repair. 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 If not we could refer to [facility] in [place].     No follow-ups on file.  Jerilee Field, MD  [facility]  [address] [place], [place] [address] [phone]

## 2023-02-22 NOTE — Progress Notes (Deleted)
[address] [place] [address] Dept: [phone]  FOLLOW UP NOTE  Patient ID: Adam Gallegos, male    DOB: 08-02-2000  Age: 22 y.o. MRN: [id number] Date of Office Visit: 02/25/2023  Assessment  Chief Complaint: No chief complaint on file.  HPI Adam Gallegos is a 22 year old male who presents to the clinic for a follow up visit. He was last seen in this clinic on 02/13/2023 by Dr. Allena Katz as a new patient for evaluation of asthma, allergic rhinitis and EOE. His last environmental allergy testing was on 01/14/2023 and was positive to tree pollen, grass pollen, weed pollen, dust mites, cat, cockroach, and horses. He continues to follow up with Dr. Marletta Lor at [facility] for management of EOE. His last EGD was on 12/15/2022.         Component 2 mo ago  SURGICAL PATHOLOGY SURGICAL PATHOLOGY CASE: [id number] PATIENT: Adam Gallegos Surgical Pathology Report     Clinical History: Dysphagia, LUQ pain, GERD (las)     FINAL MICROSCOPIC DIAGNOSIS:  A. GASTRIC, BIOPSY: - Gastric oxyntic mucosa with mild reactive changes - Negative for H. pylori on HE stain - Negative for intestinal metaplasia, dysplasia or malignancy  B. ESOPHAGEAL, MID, BIOPSY: - Benign squamous mucosa with eosinophil rich esophagitis (up to 40 eosinophils per high-power field).  See comment.  C. GE JUNCTION, BIOPSY: - Benign gastroesophageal junctional mucosa with mild reflux changes - Negative for intestinal metaplasia, dysplasia or malignancy   COMMENT:  B.  The findings are compatible with eosinophilic esophagitis in the appropriate clinical and endoscopic context.     Drug Allergies:  Allergies  Allergen Reactions   Penicillins Hives   Coconut Fatty Acid Other (See Comments)    I swell up"   Latex Hives    Physical Exam: There were no vitals taken for this visit.   Physical Exam  Diagnostics:    Assessment and Plan: No diagnosis  found.  No orders of the defined types were placed in this encounter.   There are no Patient Instructions on file for this visit.  No follow-ups on file.    Thank you for the opportunity to care for this patient.  Please do not hesitate to contact me with questions.  Thermon Leyland, FNP [facility]

## 2023-02-24 LAB — TESTOSTERONE, FREE, TOTAL, SHBG
Sex Hormone Binding: 17.6 nmol/L (ref 16.5–55.9)
Testosterone, Free: 8.3 pg/mL — ABNORMAL LOW (ref 9.3–26.5)
Testosterone: 261 ng/dL — ABNORMAL LOW (ref 264–916)

## 2023-02-25 ENCOUNTER — Ambulatory Visit: Payer: MEDICAID | Admitting: Family Medicine

## 2023-03-08 ENCOUNTER — Ambulatory Visit: Payer: MEDICAID | Admitting: General Surgery

## 2023-03-14 ENCOUNTER — Ambulatory Visit: Payer: MEDICAID | Admitting: Urology

## 2023-03-22 ENCOUNTER — Encounter: Payer: Self-pay | Admitting: Gastroenterology

## 2023-03-23 NOTE — Progress Notes (Signed)
Left patient a message that [facility] did recommend follow up and let him know to call the office if he wanted to schedule an appointment

## 2023-05-23 ENCOUNTER — Ambulatory Visit (INDEPENDENT_AMBULATORY_CARE_PROVIDER_SITE_OTHER): Payer: MEDICAID | Admitting: Urology

## 2023-05-23 VITALS — BP 104/63 | HR 88

## 2023-05-23 DIAGNOSIS — R7989 Other specified abnormal findings of blood chemistry: Secondary | ICD-10-CM

## 2023-05-23 DIAGNOSIS — N529 Male erectile dysfunction, unspecified: Secondary | ICD-10-CM | POA: Diagnosis not present

## 2023-05-23 DIAGNOSIS — N62 Hypertrophy of breast: Secondary | ICD-10-CM

## 2023-05-23 MED ORDER — TADALAFIL 5 MG PO TABS: ORAL_TABLET | ORAL | 11 refills | Status: AC

## 2023-05-23 NOTE — Progress Notes (Signed)
05/23/2023 1:42 PM   Adam Gallegos February 02, 2001 [id number]  Referring provider: Dion Saucier, PA-C [address] [place],  [place] [address]  No chief complaint on file.   HPI:  F/u -    1) ED - he has issues getting and maintaining erection. He tried a "blue chew" that contained vardenifil which helped but was expensive. ED for as long as he can remember.    2) low T - He has been on ADHD meds and steroids for asthma. He had somewhat delayed puberty. They stopped meds and he hit puberty at age 22. He also c/o low libido and fatigue. He is tired during the day. He feels like he is gaining weight. Can't lose weight. Has gynecomastia and more of a male body type - "gains weight in hips" - saw Gen surg in [place]. Minimal fluid when he ejaculates. He feels like his testicle are small. His Aug 2024 T was 311, free T 75. He would like to have kids.    Here with his significant other. He has no kids. She has a 15 mo old.   Today, seen for the above. His Sep 2024 T 261. Had a lap chole. Rx for sildenafil, but he had HA and lightheaded.    He is studying for EMT.   PMH: Past Medical History:  Diagnosis Date   ADD (attention deficit disorder)    ADHD (attention deficit hyperactivity disorder)    ADHD (attention deficit hyperactivity disorder)    Anxiety    Anxiety    Asthma    Autism    Autism    Hepatic steatosis    ODD (oppositional defiant disorder)    Tourette's    Tourette's disease     Surgical History: Past Surgical History:  Procedure Laterality Date   BALLOON DILATION N/A 12/15/2022   Procedure: BALLOON DILATION;  Surgeon: Lanelle Bal, DO;  [facility];  Service: Endoscopy;  Laterality: N/A;   BIOPSY  12/15/2022   Procedure: BIOPSY;  Surgeon: Lanelle Bal, DO;  [facility];  Service: Endoscopy;;   DENTAL SURGERY     ESOPHAGOGASTRODUODENOSCOPY (EGD) WITH PROPOFOL N/A 12/15/2022   Procedure: ESOPHAGOGASTRODUODENOSCOPY (EGD) WITH  PROPOFOL;  Surgeon: Lanelle Bal, DO;  [facility];  Service: Endoscopy;  Laterality: N/A;  2:30 pm, asa 2    Home Medications:  Allergies as of 05/23/2023       Reactions   Penicillins Hives   Coconut Fatty Acid Other (See Comments)   I swell up"   Latex Hives        Medication List        Accurate as of May 23, 2023  1:42 PM. If you have any questions, ask your nurse or doctor.          albuterol 108 (90 Base) MCG/ACT inhaler Commonly known as: ProAir HFA Inhale 2 puffs into the lungs every 6 (six) hours as needed (Asthma).   albuterol (2.5 MG/3ML) 0.083% nebulizer solution Commonly known as: PROVENTIL Take 3 mLs (2.5 mg total) by nebulization every 4 (four) hours as needed for shortness of breath or wheezing.   Asmanex HFA 100 MCG/ACT Aero Generic drug: Mometasone Furoate Inhale 2 puffs into the lungs in the morning and at bedtime.   cetirizine 10 MG tablet Commonly known as: ZyrTEC Allergy Take 1 tablet (10 mg total) by mouth daily.   cromolyn 4 % ophthalmic solution Commonly known as: OPTICROM Place 1 drop  into both eyes in the morning and at bedtime.   dicyclomine 20 MG tablet Commonly known as: BENTYL Take 1 tablet (20 mg total) by mouth daily as needed for spasms (sublingual).   levocetirizine 5 MG tablet Commonly known as: XYZAL Take 5 mg by mouth every evening.   montelukast 10 MG tablet Commonly known as: SINGULAIR Take 1 tablet (10 mg total) by mouth at bedtime.   omeprazole 40 MG capsule Commonly known as: PRILOSEC Take 1 capsule (40 mg total) by mouth 2 (two) times daily.   ondansetron 4 MG disintegrating tablet Commonly known as: ZOFRAN-ODT Take 1 tablet (4 mg total) by mouth every 8 (eight) hours as needed for nausea or vomiting.   sildenafil 20 MG tablet Commonly known as: REVATIO Take 1-5 tablets as needed before sex   sucralfate 1 g tablet Commonly known as: Carafate Take 1 tablet (1 g total) by mouth 4  (four) times daily -  with meals and at bedtime for 14 days.        Allergies:  Allergies  Allergen Reactions   Penicillins Hives   Coconut Fatty Acid Other (See Comments)    I swell up"   Latex Hives    Family History: Family History  Problem Relation Age of Onset   Cancer Mother    Colon cancer Other        Think someone in his family had colon cancer, not sure who   Inflammatory bowel disease Neg Hx     Social History:  reports that he has never smoked. His smokeless tobacco use includes snuff. He reports that he does not drink alcohol and does not use drugs.   Physical Exam: There were no vitals taken for this visit.  Constitutional:  Alert and oriented, No acute distress. HEENT: [place] AT, moist mucus membranes.  Trachea midline, no masses. Cardiovascular: No clubbing, cyanosis, or edema. Respiratory: Normal respiratory effort, no increased work of breathing. GI: Abdomen is soft, nontender, nondistended, no abdominal masses GU: No CVA tenderness Lymph: No cervical or inguinal lymphadenopathy. Skin: No rashes, bruises or suspicious lesions. Neurologic: Grossly intact, no focal deficits, moving all 4 extremities. Psychiatric: Normal mood and affect.  Laboratory Data: No results found for: "WBC", "HGB", "HCT", "MCV", "PLT"  No results found for: "CREATININE"  No results found for: "PSA"  Lab Results  Component Value Date   TESTOSTERONE 261 (L) 02/21/2023    No results found for: "HGBA1C"  Urinalysis No results found for: "COLORURINE", "APPEARANCEUR", "LABSPEC", "PHURINE", "GLUCOSEU", "HGBUR", "BILIRUBINUR", "KETONESUR", "PROTEINUR", "UROBILINOGEN", "NITRITE", "LEUKOCYTESUR"  No results found for: "LABMICR", "WBCUA", "RBCUA", "LABEPIT", "MUCUS", "BACTERIA"  Pertinent Imaging: N/a  Assessment & Plan:    1. Erectile dysfunction, unspecified erectile dysfunction type (Primary) Trial of tadalafil   2. Low testosterone in male Check labs one AM   3.  Gynecomastia - referred to Dr. Dwain Sarna   No follow-ups on file.  Jerilee Field, MD  [facility]  [address] [place], [place] [address] [phone]

## 2023-05-24 LAB — URINALYSIS, ROUTINE W REFLEX MICROSCOPIC
Bilirubin, UA: NEGATIVE
Glucose, UA: NEGATIVE
Ketones, UA: NEGATIVE
Leukocytes,UA: NEGATIVE
Nitrite, UA: NEGATIVE
Protein,UA: NEGATIVE
RBC, UA: NEGATIVE
Specific Gravity, UA: 1.025 (ref 1.005–1.030)
Urobilinogen, Ur: 1 mg/dL (ref 0.2–1.0)
pH, UA: 6.5 (ref 5.0–7.5)

## 2023-07-19 ENCOUNTER — Other Ambulatory Visit: Payer: MEDICAID

## 2023-07-19 ENCOUNTER — Other Ambulatory Visit: Payer: Self-pay

## 2023-07-19 DIAGNOSIS — R7989 Other specified abnormal findings of blood chemistry: Secondary | ICD-10-CM

## 2023-07-20 LAB — TESTOSTERONE: Testosterone: 360 ng/dL (ref 264–916)

## 2023-07-25 ENCOUNTER — Ambulatory Visit (INDEPENDENT_AMBULATORY_CARE_PROVIDER_SITE_OTHER): Payer: MEDICAID | Admitting: Urology

## 2023-07-25 VITALS — BP 115/73 | HR 85

## 2023-07-25 DIAGNOSIS — N62 Hypertrophy of breast: Secondary | ICD-10-CM

## 2023-07-25 DIAGNOSIS — R7989 Other specified abnormal findings of blood chemistry: Secondary | ICD-10-CM | POA: Diagnosis not present

## 2023-07-25 LAB — URINALYSIS, ROUTINE W REFLEX MICROSCOPIC
Bilirubin, UA: NEGATIVE
Glucose, UA: NEGATIVE
Ketones, UA: NEGATIVE
Leukocytes,UA: NEGATIVE
Nitrite, UA: NEGATIVE
Protein,UA: NEGATIVE
RBC, UA: NEGATIVE
Specific Gravity, UA: 1.03 (ref 1.005–1.030)
Urobilinogen, Ur: 1 mg/dL (ref 0.2–1.0)
pH, UA: 6 (ref 5.0–7.5)

## 2023-07-25 NOTE — Progress Notes (Signed)
07/25/2023 11:36 AM   Adam Gallegos 09-Nov-2000 [id number]  Referring provider: Dion Saucier, PA-C [address] [place],  [place] [address]  No chief complaint on file.   HPI:  F/u -     1) ED - he has issues getting and maintaining erection. He tried a "blue chew" that contained vardenifil which helped but was expensive. ED for as long as he can remember.  Rx for sildenafil, but he had HA and lightheaded. Trial of tadalafil.    2) low T - He has been on ADHD meds and steroids for asthma. He had somewhat delayed puberty. They stopped meds and he hit puberty at age 68. He also c/o low libido and fatigue. He is tired during the day. He feels like he is gaining weight. Can't lose weight. Minimal fluid when he ejaculates. He feels like his testicle are small. His Aug 2024 T was 311, free T 75. He would like to have kids. Here with his significant other. He has no kids. She has a 75 mo old.   His Sep 2024 T 261.  3) gynecomastia - he describes more of a "male body type - gains weight in hips" - saw Gen surg in [place]. Referred to Dr. Dwain Sarna to review surgical options.    Today, seen for the above. His Sep 2024 T 261. I ordered labs in Dec 2024 but those were not completed. His Feb 2025 T 360. He lost 40 lbs. Diet and exercise. 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 He did not pick up tadalafil and hasn't been sex active.   PMH: Past Medical History:  Diagnosis Date   ADD (attention deficit disorder)    ADHD (attention deficit hyperactivity disorder)    ADHD (attention deficit hyperactivity disorder)    Anxiety    Anxiety    Asthma    Autism    Autism    Hepatic steatosis    ODD (oppositional defiant disorder)    Tourette's    Tourette's disease     Surgical History: Past Surgical History:  Procedure Laterality Date   BALLOON DILATION N/A 12/15/2022   Procedure: BALLOON DILATION;  Surgeon: Lanelle Bal, DO;  [facility];  Service: Endoscopy;   Laterality: N/A;   BIOPSY  12/15/2022   Procedure: BIOPSY;  Surgeon: Lanelle Bal, DO;  [facility];  Service: Endoscopy;;   DENTAL SURGERY     ESOPHAGOGASTRODUODENOSCOPY (EGD) WITH PROPOFOL N/A 12/15/2022   Procedure: ESOPHAGOGASTRODUODENOSCOPY (EGD) WITH PROPOFOL;  Surgeon: Lanelle Bal, DO;  [facility];  Service: Endoscopy;  Laterality: N/A;  2:30 pm, asa 2    Home Medications:  Allergies as of 07/25/2023       Reactions   Penicillins Hives   Coconut Fatty Acid Other (See Comments)   I swell up"   Latex Hives        Medication List        Accurate as of July 25, 2023 11:36 AM. If you have any questions, ask your nurse or doctor.          albuterol 108 (90 Base) MCG/ACT inhaler Commonly known as: ProAir HFA Inhale 2 puffs into the lungs every 6 (six) hours as needed (Asthma).   albuterol (2.5 MG/3ML) 0.083% nebulizer solution Commonly known as: PROVENTIL Take 3 mLs (2.5 mg total) by nebulization every 4 (four) hours as needed for shortness of breath or wheezing.   Asmanex HFA 100 MCG/ACT Aero  Generic drug: Mometasone Furoate Inhale 2 puffs into the lungs in the morning and at bedtime.   cetirizine 10 MG tablet Commonly known as: ZyrTEC Allergy Take 1 tablet (10 mg total) by mouth daily.   cromolyn 4 % ophthalmic solution Commonly known as: OPTICROM Place 1 drop into both eyes in the morning and at bedtime.   dicyclomine 20 MG tablet Commonly known as: BENTYL Take 1 tablet (20 mg total) by mouth daily as needed for spasms (sublingual).   levocetirizine 5 MG tablet Commonly known as: XYZAL Take 5 mg by mouth every evening.   montelukast 10 MG tablet Commonly known as: SINGULAIR Take 1 tablet (10 mg total) by mouth at bedtime.   omeprazole 40 MG capsule Commonly known as: PRILOSEC Take 1 capsule (40 mg total) by mouth 2 (two) times daily.   ondansetron 4 MG disintegrating tablet Commonly known as: ZOFRAN-ODT Take  1 tablet (4 mg total) by mouth every 8 (eight) hours as needed for nausea or vomiting.   sildenafil 20 MG tablet Commonly known as: REVATIO Take 1-5 tablets as needed before sex   sucralfate 1 g tablet Commonly known as: Carafate Take 1 tablet (1 g total) by mouth 4 (four) times daily -  with meals and at bedtime for 14 days.   tadalafil 5 MG tablet Commonly known as: CIALIS Take 1 - 4 tablets daily PRN prior to sexual activity        Allergies:  Allergies  Allergen Reactions   Penicillins Hives   Coconut Fatty Acid Other (See Comments)    I swell up"   Latex Hives    Family History: Family History  Problem Relation Age of Onset   Cancer Mother    Colon cancer Other        Think someone in his family had colon cancer, not sure who   Inflammatory bowel disease Neg Hx     Social History:  reports that he has never smoked. His smokeless tobacco use includes snuff. He reports that he does not drink alcohol and does not use drugs.   Physical Exam: BP 115/73   Pulse 85   Constitutional:  Alert and oriented, No acute distress. HEENT: [place] AT, moist mucus membranes.  Trachea midline, no masses. Cardiovascular: No clubbing, cyanosis, or edema. Respiratory: Normal respiratory effort, no increased work of breathing. GI: Abdomen is soft, nontender, nondistended, no abdominal masses GU: No CVA tenderness Skin: No rashes, bruises or suspicious lesions. Neurologic: Grossly intact, no focal deficits, moving all 4 extremities. Psychiatric: Normal mood and affect.  Laboratory Data: No results found for: "WBC", "HGB", "HCT", "MCV", "PLT"  No results found for: "CREATININE"  No results found for: "PSA"  Lab Results  Component Value Date   TESTOSTERONE 360 07/19/2023    No results found for: "HGBA1C"  Urinalysis    Component Value Date/Time   APPEARANCEUR Clear 05/23/2023 1354   GLUCOSEU Negative 05/23/2023 1354   BILIRUBINUR Negative 05/23/2023 1354   PROTEINUR  Negative 05/23/2023 1354   NITRITE Negative 05/23/2023 1354   LEUKOCYTESUR Negative 05/23/2023 1354    Lab Results  Component Value Date   LABMICR Comment 05/23/2023    Pertinent Imaging:   Assessment & Plan:    1. Low testosterone in male (Primary) Check labs  - Urinalysis, Routine w reflex microscopic  2. Gynecomastia - referred to Dr. Dwain Sarna.   No follow-ups on file.  Jerilee Field, MD  [facility]  [address] [place], [place]  [address] [phone]   

## 2023-07-25 NOTE — Progress Notes (Signed)
 Marland Kitchen

## 2023-08-08 LAB — ESTRADIOL, FREE
Estradiol, Serum, MS: 14 pg/mL
Free Estradiol, Percent: 2.6 %
Free Estradiol, Serum: 0.36 pg/mL

## 2023-08-08 LAB — FOLLICLE STIMULATING HORMONE: FSH: 3.3 m[IU]/mL (ref 1.5–12.4)

## 2023-08-08 LAB — LUTEINIZING HORMONE: LH: 8.5 m[IU]/mL (ref 1.7–8.6)

## 2023-08-08 LAB — PROLACTIN: Prolactin: 48.5 ng/mL — ABNORMAL HIGH (ref 3.6–31.5)

## 2023-08-10 ENCOUNTER — Other Ambulatory Visit: Payer: Self-pay | Admitting: General Surgery

## 2023-08-10 DIAGNOSIS — N62 Hypertrophy of breast: Secondary | ICD-10-CM

## 2023-08-14 ENCOUNTER — Other Ambulatory Visit: Payer: Self-pay | Admitting: Urology

## 2023-08-14 DIAGNOSIS — E221 Hyperprolactinemia: Secondary | ICD-10-CM

## 2023-09-14 ENCOUNTER — Ambulatory Visit: Payer: MEDICAID | Admitting: Plastic Surgery

## 2023-09-14 ENCOUNTER — Telehealth: Payer: Self-pay

## 2023-09-14 ENCOUNTER — Encounter: Payer: Self-pay | Admitting: Plastic Surgery

## 2023-09-14 VITALS — BP 118/75 | HR 102 | Ht 72.0 in | Wt 185.2 lb

## 2023-09-14 DIAGNOSIS — N62 Hypertrophy of breast: Secondary | ICD-10-CM

## 2023-09-14 NOTE — Telephone Encounter (Signed)
 Patient called back stating the Endo office in [place] could not move his appointment sooner than 11/07/2023, because the provider does not start until sometime in May.  He stated he was told to call back to see if we [facility] could see him sooner.

## 2023-09-14 NOTE — Progress Notes (Signed)
 Referring Provider Dion Saucier, PA-C [address] [place],  [place] [address]   CC:  Chief Complaint  Patient presents with   Advice Only      Adam Gallegos is an 23 y.o. male.  HPI: Mr. Ardoin is a 23 year old male who is referred by Dr. Carolynne Edouard for evaluation of gynecomastia.  Patient has a long and complicated history of use of ADHD medications which he states delayed onset of puberty and caused a hormonal imbalance resulting in his gynecomastia.  Patient has a change in social habits due to the enlargement of his breast.  He is requesting treatment.  He does endorse pain in the breast and did have a white discharge from his nipples approximately a year ago.  Allergies  Allergen Reactions   Penicillins Hives   Coconut Fatty Acid Other (See Comments)    I swell up"   Latex Hives    Outpatient Encounter Medications as of 09/14/2023  Medication Sig   albuterol (PROAIR HFA) 108 (90 Base) MCG/ACT inhaler Inhale 2 puffs into the lungs every 6 (six) hours as needed (Asthma).   albuterol (PROVENTIL) (2.5 MG/3ML) 0.083% nebulizer solution Take 3 mLs (2.5 mg total) by nebulization every 4 (four) hours as needed for shortness of breath or wheezing.   cetirizine (ZYRTEC ALLERGY) 10 MG tablet Take 1 tablet (10 mg total) by mouth daily.   cromolyn (OPTICROM) 4 % ophthalmic solution Place 1 drop into both eyes in the morning and at bedtime.   dicyclomine (BENTYL) 20 MG tablet Take 1 tablet (20 mg total) by mouth daily as needed for spasms (sublingual).   levocetirizine (XYZAL) 5 MG tablet Take 5 mg by mouth every evening.   Mometasone Furoate (ASMANEX HFA) 100 MCG/ACT AERO Inhale 2 puffs into the lungs in the morning and at bedtime.   montelukast (SINGULAIR) 10 MG tablet Take 1 tablet (10 mg total) by mouth at bedtime.   omeprazole (PRILOSEC) 40 MG capsule Take 1 capsule (40 mg total) by mouth 2 (two) times daily.   ondansetron (ZOFRAN-ODT) 4 MG disintegrating tablet Take 1 tablet (4 mg total) by  mouth every 8 (eight) hours as needed for nausea or vomiting.   sildenafil (REVATIO) 20 MG tablet Take 1-5 tablets as needed before sex   tadalafil (CIALIS) 5 MG tablet Take 1 - 4 tablets daily PRN prior to sexual activity   sucralfate (CARAFATE) 1 g tablet Take 1 tablet (1 g total) by mouth 4 (four) times daily -  with meals and at bedtime for 14 days.   No facility-administered encounter medications on file as of 09/14/2023.     Past Medical History:  Diagnosis Date   ADD (attention deficit disorder)    ADHD (attention deficit hyperactivity disorder)    ADHD (attention deficit hyperactivity disorder)    Anxiety    Anxiety    Asthma    Autism    Autism    Hepatic steatosis    ODD (oppositional defiant disorder)    Tourette's    Tourette's disease     Past Surgical History:  Procedure Laterality Date   BALLOON DILATION N/A 12/15/2022   Procedure: BALLOON DILATION;  Surgeon: Lanelle Bal, DO;  [facility];  Service: Endoscopy;  Laterality: N/A;   BIOPSY  12/15/2022   Procedure: BIOPSY;  Surgeon: Lanelle Bal, DO;  [facility];  Service: Endoscopy;;   DENTAL SURGERY     ESOPHAGOGASTRODUODENOSCOPY (EGD) WITH PROPOFOL N/A 12/15/2022  Procedure: ESOPHAGOGASTRODUODENOSCOPY (EGD) WITH PROPOFOL;  Surgeon: Lanelle Bal, DO;  [facility];  Service: Endoscopy;  Laterality: N/A;  2:30 pm, asa 2    Family History  Problem Relation Age of Onset   Cancer Mother    Colon cancer Other        Think someone in his family had colon cancer, not sure who   Inflammatory bowel disease Neg Hx     Social History   Social History Narrative   Not on file     Review of Systems General: Denies fevers, chills, weight loss CV: Denies chest pain, shortness of breath, palpitations Breast: Enlargement of the breast bilaterally with pain to palpation and a remote history of white nipple discharge  Physical Exam    09/14/2023   10:22 AM 07/25/2023    11:35 AM 05/23/2023    1:54 PM  Vitals with BMI  Height 6\' 0"     Weight 185 lbs 3 oz    BMI 25.11    Systolic 118 115 540  Diastolic 75 73 63  Pulse 102 85 88    General:  No acute distress,  Alert and oriented, Non-Toxic, Normal speech and affect Breast: Patient has bilateral breast hypertrophy.  There is a palpable breast bud behind each areola and this is tender to palpation.  There is no nipple discharge on examination today and there are no other dominant masses in the breast. Endocrine: Estradiol level is normal, testosterone level is low normal but has been below normal, prolactin level is markedly elevated. Mammogram: Mammogram and ultrasound are pending for next week. Assessment/Plan Gynecomastia: Patient has moderate gynecomastia bilaterally.  This is primarily due to hypertrophy of the breast tissue.  There is some subcutaneous fat but this is not significant.  I believe that he would be a good candidate for a direct excision with additional liposuction to smooth the contours.  I have discussed this at length with him.  We discussed the risks of nipple loss due to nipple ischemia, seroma formation bleeding infection.  I have discussed with him the use of drains postoperatively.  He understands that there may be some distortion of the nipple after the excision.  He understands that his chest will not be perfectly flat but will be improved.  Photographs were obtained today with his consent.  He is still in school at this time and we will wait until after he is finished with his clinicals and his heavy lifting to schedule surgery.  Additionally I would like to wait until after he has his endocrine appointment for a final disposition on the elevated prolactin levels in the low normal testosterone levels.  He is going to contact his endocrinologist and see if he can move his appointment which is scheduled for June up to a sooner appointment.  Will follow-up on his mammogram and  ultrasound.  Santiago Glad 09/14/2023, 10:59 AM

## 2023-09-21 ENCOUNTER — Ambulatory Visit
Admission: RE | Admit: 2023-09-21 | Discharge: 2023-09-21 | Disposition: A | Discharge status: Home / Self Care | Payer: MEDICAID | Source: Ambulatory Visit | Attending: General Surgery | Admitting: General Surgery

## 2023-09-21 ENCOUNTER — Ambulatory Visit: Payer: MEDICAID

## 2023-09-21 DIAGNOSIS — N62 Hypertrophy of breast: Secondary | ICD-10-CM

## 2023-10-05 ENCOUNTER — Encounter: Payer: Self-pay | Admitting: General Surgery

## 2023-10-07 ENCOUNTER — Other Ambulatory Visit: Payer: Self-pay

## 2023-10-07 ENCOUNTER — Encounter: Payer: Self-pay | Admitting: Family Medicine

## 2023-10-07 ENCOUNTER — Ambulatory Visit (INDEPENDENT_AMBULATORY_CARE_PROVIDER_SITE_OTHER): Payer: MEDICAID | Admitting: Family Medicine

## 2023-10-07 VITALS — BP 114/70 | HR 100 | Temp 98.4°F | Resp 16 | Ht 72.0 in | Wt 185.0 lb

## 2023-10-07 DIAGNOSIS — K219 Gastro-esophageal reflux disease without esophagitis: Secondary | ICD-10-CM | POA: Diagnosis not present

## 2023-10-07 DIAGNOSIS — J45998 Other asthma: Secondary | ICD-10-CM | POA: Diagnosis not present

## 2023-10-07 DIAGNOSIS — J3089 Other allergic rhinitis: Secondary | ICD-10-CM | POA: Diagnosis not present

## 2023-10-07 DIAGNOSIS — J302 Other seasonal allergic rhinitis: Secondary | ICD-10-CM | POA: Insufficient documentation

## 2023-10-07 MED ORDER — ALBUTEROL SULFATE HFA 108 (90 BASE) MCG/ACT IN AERS: 2.0000 | INHALATION_SPRAY | Freq: Four times a day (QID) | RESPIRATORY_TRACT | 1 refills | Status: DC | PRN

## 2023-10-07 MED ORDER — MONTELUKAST SODIUM 10 MG PO TABS: 10.0000 mg | ORAL_TABLET | Freq: Every day | ORAL | 5 refills | Status: DC

## 2023-10-07 MED ORDER — ALBUTEROL SULFATE (2.5 MG/3ML) 0.083% IN NEBU: 2.5000 mg | INHALATION_SOLUTION | RESPIRATORY_TRACT | 1 refills | Status: DC | PRN

## 2023-10-07 MED ORDER — BUDESONIDE-FORMOTEROL FUMARATE 160-4.5 MCG/ACT IN AERO: 2.0000 | INHALATION_SPRAY | Freq: Two times a day (BID) | RESPIRATORY_TRACT | 5 refills | Status: AC

## 2023-10-07 NOTE — Patient Instructions (Addendum)
 Asthma Begin Dulera 200-2 puffs twice a day with a spacer to prevent cough or wheeze Continue montelukast  10 mg once a day to prevent cough or wheeze Continue albuterol  2 puffs every 4 hours as needed for cough or wheeze OR Instead use albuterol  0.083% solution via nebulizer one unit vial every 4 hours as needed for cough or wheeze You may use albuterol  2 puffs 5 to 15 minutes before activity to decrease cough or wheeze  Allergic rhinitis Continue allergen avoidance measures directed toward grass pollen, tree pollen, weed pollen, mold, dust mite, cat, cockroach, and horse as listed below Continue montelukast  10 mg once a day to control allergy  symptoms Begin carbinoxamine 4 mg tablets 1-2 tablets twice a day if needed for a runny nose or itch Consider saline nasal rinses as needed for nasal symptoms. Use this before any medicated nasal sprays for best result Consider allergen immunotherapy if your symptoms are not well-controlled with the treatment plan as listed above  Reflux Continue omeprazole  40 mg twice a day as previously prescribed Continue lifestyle and diet modifications as listed below Follow-up with your GI provider as recommended  Call the clinic if this treatment plan is not working well for you.    Follow up in 2 months or sooner if needed.  Reducing Pollen Exposure The American Academy of Allergy , Asthma and Immunology suggests the following steps to reduce your exposure to pollen during allergy  seasons. Do not hang sheets or clothing out to dry; pollen may collect on these items. Do not mow lawns or spend time around freshly cut grass; mowing stirs up pollen. Keep windows closed at night.  Keep car windows closed while driving. Minimize morning activities outdoors, a time when pollen counts are usually at their highest. Stay indoors as much as possible when pollen counts or humidity is high and on windy days when pollen tends to remain in the air longer. Use air  conditioning when possible.  Many air conditioners have filters that trap the pollen spores. Use a HEPA room air filter to remove pollen form the indoor air you breathe.  Control of Mold Allergen Mold and fungi can grow on a variety of surfaces provided certain temperature and moisture conditions exist.  Outdoor molds grow on plants, decaying vegetation and soil.  The major outdoor mold, Alternaria and Cladosporium, are found in very high numbers during hot and dry conditions.  Generally, a late Summer - Fall peak is seen for common outdoor fungal spores.  Rain will temporarily lower outdoor mold spore count, but counts rise rapidly when the rainy period ends.  The most important indoor molds are Aspergillus and Penicillium.  Dark, humid and poorly ventilated basements are ideal sites for mold growth.  The next most common sites of mold growth are the bathroom and the kitchen.  Outdoor [employer] Use air conditioning and keep windows closed Avoid exposure to decaying vegetation. Avoid leaf raking. Avoid grain handling. Consider wearing a face mask if working in moldy areas.  Indoor Mold Control Maintain humidity below 50%. Clean washable surfaces with 5% bleach solution. Remove sources e.g. Contaminated carpets.   Control of Dust Mite Allergen Dust mites play a major role in allergic asthma and rhinitis. They occur in environments with high humidity wherever human skin is found. Dust mites absorb humidity from the atmosphere (ie, they do not drink) and feed on organic matter (including shed human and animal skin). Dust mites are a microscopic type of insect that you cannot see with the  naked eye. High levels of dust mites have been detected from mattresses, pillows, carpets, upholstered furniture, bed covers, clothes, soft toys and any woven material. The principal allergen of the dust mite is found in its feces. A gram of dust may contain 1,000 mites and 250,000 fecal particles. Mite antigen  is easily measured in the air during house cleaning activities. Dust mites do not bite and do not cause harm to humans, other than by triggering allergies/asthma.  Ways to decrease your exposure to dust mites in your home:  1. Encase mattresses, box springs and pillows with a mite-impermeable barrier or cover  2. Wash sheets, blankets and drapes weekly in hot water (130 F) with detergent and dry them in a dryer on the hot setting.  3. Have the room cleaned frequently with a vacuum cleaner and a damp dust-mop. For carpeting or rugs, vacuuming with a vacuum cleaner equipped with a high-efficiency particulate air (HEPA) filter. The dust mite allergic individual should not be in a room which is being cleaned and should wait 1 hour after cleaning before going into the room.  4. Do not sleep on upholstered furniture (eg, couches).  5. If possible removing carpeting, upholstered furniture and drapery from the home is ideal. Horizontal blinds should be eliminated in the rooms where the person spends the most time (bedroom, study, television room). Washable vinyl, roller-type shades are optimal.  6. Remove all non-washable stuffed toys from the bedroom. Wash stuffed toys weekly like sheets and blankets above.  7. Reduce indoor humidity to less than 50%. Inexpensive humidity monitors can be purchased at most hardware stores. Do not use a humidifier as can make the problem worse and are not recommended.  Control of Dog or Cat Allergen Avoidance is the best way to manage a dog or cat allergy . If you have a dog or cat and are allergic to dog or cats, consider removing the dog or cat from the home. If you have a dog or cat but don't want to find it a new home, or if your family wants a pet even though someone in the household is allergic, here are some strategies that may help keep symptoms at bay:  Keep the pet out of your bedroom and restrict it to only a few rooms. Be advised that keeping the dog or cat  in only one room will not limit the allergens to that room. Don't pet, hug or kiss the dog or cat; if you do, wash your hands with soap and water. High-efficiency particulate air (HEPA) cleaners run continuously in a bedroom or living room can reduce allergen levels over time. Regular use of a high-efficiency vacuum cleaner or a central vacuum can reduce allergen levels. Giving your dog or cat a bath at least once a week can reduce airborne allergen.  Control of Cockroach Allergen Cockroach allergen has been identified as an important cause of acute attacks of asthma, especially in urban settings.  There are fifty-five species of cockroach that exist in the [place] , however only three, the [place], [place] and [place] species produce allergen that can affect patients with Asthma.  Allergens can be obtained from fecal particles, egg casings and secretions from cockroaches.    Remove food sources. Reduce access to water. Seal access and entry points. Spray runways with 0.5-1% Diazinon or Chlorpyrifos Blow boric acid power under stoves and refrigerator. Place bait stations (hydramethylnon) at feeding sites.

## 2023-10-07 NOTE — Progress Notes (Signed)
 [address] [place] [address] Dept: [phone]  FOLLOW UP NOTE  Patient ID: Adam Gallegos, male    DOB: 15-Apr-2001  Age: 23 y.o. MRN: [id number] Date of Office Visit: 10/07/2023  Assessment  Chief Complaint: Follow-up (Allergy /asthma) and Nasal Congestion  HPI Adam Gallegos is a 23 year old male who presents to clinic for a follow-up visit.  He was last seen in this clinic on 01/14/2019 for by Dr. Lydia Sams for evaluation of asthma, allergic rhinitis, and reflux.    At today's visit, he reports his asthma has been poorly controlled with symptoms including shortness of breath, wheeze, and cough occurring most days of the week.  He continues montelukast  daily and uses albuterol  at least daily and sometimes up to 3 times a day with only moderate relief of symptoms.  He reports that he has previously used Asmanex  with no improvement in his asthma symptoms.  He reports that over the last month he has increased montelukast  to 2 times daily with no relief of asthma symptoms.    Allergic rhinitis is reported as poorly controlled with symptoms including clear rhinorrhea, nasal congestion, sneezing, and occasional postnasal drainage.  He continues levocetirizine and has recently increased to 5 mg twice a day with only mild improvement.  He continues supplemental Benadryl most days of the week over the last month.  He is not currently using Flonase, azelastine , or nasal saline rinses as he reports all of these things make his nose bleed.  He denies epistaxis without nasal perforations.  His last environmental allergy  skin testing was on 01/14/2023 and was positive to tree pollen, grass pollen, weed pollen, mold, dust mite, cat, cockroach, and horse.  Reflux is reported as moderately well-controlled with occasional heartburn as the main symptom.  He continues omeprazole  and follows up with GI as recommended.  Current medications are listed in the chart.  Drug Allergies:  Allergies   Allergen Reactions   Penicillins Hives   Coconut Fatty Acid Other (See Comments)    I swell up"   Latex Hives    Physical Exam: BP 114/70   Pulse 100   Temp 98.4 F (36.9 C)   Resp 16   Ht 6' (1.829 m)   Wt 185 lb (83.9 kg)   SpO2 96%   BMI 25.09 kg/m    Physical Exam Vitals reviewed.  Constitutional:      Appearance: Normal appearance.  HENT:     Head: Normocephalic and atraumatic.     Right Ear: Tympanic membrane normal.     Left Ear: Tympanic membrane normal.     Nose:     Comments: Bilateral nares edematous and pale with thin clear nasal drainage noted.  Pharynx normal.  Ears normal.  Eyes normal.    Mouth/Throat:     Pharynx: Oropharynx is clear.  Eyes:     Conjunctiva/sclera: Conjunctivae normal.  Cardiovascular:     Rate and Rhythm: Normal rate and regular rhythm.     Heart sounds: Normal heart sounds. No murmur heard. Pulmonary:     Effort: Pulmonary effort is normal.     Comments: Bilateral expiratory wheeze which cleared postbronchodilator therapy Musculoskeletal:        General: Normal range of motion.     Cervical back: Normal range of motion and neck supple.  Skin:    General: Skin is warm and dry.  Neurological:     Mental Status: He is alert and oriented to person, place, and time.  Psychiatric:  Mood and Affect: Mood normal.        Behavior: Behavior normal.        Thought Content: Thought content normal.        Judgment: Judgment normal.     Diagnostics: FVC 4.32 which is 79% of predicted value, FEV1 2.99 which is 64% of predicted value.  Spirometry indicates airway obstruction.  Postbronchodilator FVC 5.22, postbronchodilator FEV1 3.58.    Assessment and Plan: 1. Poorly controlled persistent asthma   2. Seasonal and perennial allergic rhinitis   3. Gastroesophageal reflux disease, unspecified whether esophagitis present     Meds ordered this encounter  Medications   albuterol  (PROAIR  HFA) 108 (90 Base) MCG/ACT inhaler    Sig:  Inhale 2 puffs into the lungs every 6 (six) hours as needed (Asthma).    Dispense:  18 g    Refill:  1   albuterol  (PROVENTIL ) (2.5 MG/3ML) 0.083% nebulizer solution    Sig: Take 3 mLs (2.5 mg total) by nebulization every 4 (four) hours as needed for shortness of breath or wheezing.    Dispense:  75 mL    Refill:  1   montelukast  (SINGULAIR ) 10 MG tablet    Sig: Take 1 tablet (10 mg total) by mouth at bedtime.    Dispense:  30 tablet    Refill:  5   budesonide-formoterol (SYMBICORT) 160-4.5 MCG/ACT inhaler    Sig: Inhale 2 puffs into the lungs 2 (two) times daily.    Dispense:  1 each    Refill:  5    Patient Instructions  Asthma Begin Dulera 200-2 puffs twice a day with a spacer to prevent cough or wheeze Continue montelukast  10 mg once a day to prevent cough or wheeze Continue albuterol  2 puffs every 4 hours as needed for cough or wheeze OR Instead use albuterol  0.083% solution via nebulizer one unit vial every 4 hours as needed for cough or wheeze You may use albuterol  2 puffs 5 to 15 minutes before activity to decrease cough or wheeze  Allergic rhinitis Continue allergen avoidance measures directed toward grass pollen, tree pollen, weed pollen, mold, dust mite, cat, cockroach, and horse as listed below Continue montelukast  10 mg once a day to control allergy  symptoms Begin carbinoxamine 4 mg tablets 1-2 tablets twice a day if needed for a runny nose or itch Consider saline nasal rinses as needed for nasal symptoms. Use this before any medicated nasal sprays for best result Consider allergen immunotherapy if your symptoms are not well-controlled with the treatment plan as listed above  Reflux Continue omeprazole  40 mg twice a day as previously prescribed Continue lifestyle and diet modifications as listed below Follow-up with your GI provider as recommended  Call the clinic if this treatment plan is not working well for you.    Follow up in 2 months or sooner if  needed.   Return in about 2 months (around 12/07/2023), or if symptoms worsen or fail to improve.    Thank you for the opportunity to care for this patient.  Please do not hesitate to contact me with questions.  Marinus Sic, FNP [facility] 

## 2023-10-21 ENCOUNTER — Encounter: Payer: Self-pay | Admitting: "Endocrinology

## 2023-10-21 ENCOUNTER — Ambulatory Visit (INDEPENDENT_AMBULATORY_CARE_PROVIDER_SITE_OTHER): Payer: MEDICAID | Admitting: "Endocrinology

## 2023-10-21 VITALS — BP 108/64 | HR 72 | Ht 72.0 in | Wt 191.2 lb

## 2023-10-21 DIAGNOSIS — E221 Hyperprolactinemia: Secondary | ICD-10-CM | POA: Diagnosis not present

## 2023-10-21 DIAGNOSIS — F17200 Nicotine dependence, unspecified, uncomplicated: Secondary | ICD-10-CM | POA: Diagnosis not present

## 2023-10-21 NOTE — Progress Notes (Signed)
 Endocrinology Consult Note                                            10/21/2023, 9:02 AM   Subjective:    Patient ID: Adam Gallegos, male    DOB: [birth year], PCP Sarrah Cure, PA-C   Past Medical History:  Diagnosis Date   ADD (attention deficit disorder)    ADHD (attention deficit hyperactivity disorder)    ADHD (attention deficit hyperactivity disorder)    Anxiety    Anxiety    Asthma    Autism    Autism    Hepatic steatosis    ODD (oppositional defiant disorder)    Tourette's    Tourette's disease    Past Surgical History:  Procedure Laterality Date   BALLOON DILATION N/A 12/15/2022   Procedure: BALLOON DILATION;  Surgeon: Vinetta Greening, DO;  Location: [facility];  Service: Endoscopy;  Laterality: N/A;   BIOPSY  12/15/2022   Procedure: BIOPSY;  Surgeon: Vinetta Greening, DO;  Location: [facility];  Service: Endoscopy;;   DENTAL SURGERY     ESOPHAGOGASTRODUODENOSCOPY (EGD) WITH PROPOFOL  N/A 12/15/2022   Procedure: ESOPHAGOGASTRODUODENOSCOPY (EGD) WITH PROPOFOL ;  Surgeon: Vinetta Greening, DO;  Location: [facility];  Service: Endoscopy;  Laterality: N/A;  2:30 pm, asa 2   Social History   Socioeconomic History   Marital status: Single    Spouse name: Not on file   Number of children: Not on file   Years of education: Not on file   Highest education level: Not on file  Occupational History   Not on file  Tobacco Use   Smoking status: Some Days    Types: E-cigarettes   Smokeless tobacco: Current    Types: Snuff  Vaping Use   Vaping status: Every Day  Substance and Sexual Activity   Alcohol use: No   Drug use: No   Sexual activity: Yes    Birth control/protection: Condom  Other Topics Concern   Not on file  Social History Narrative   Not on file   Social Drivers of Health   Financial Resource Strain: Not on file  Food Insecurity: Not on file  Transportation Needs: Not on file  Physical Activity: Not on file  Stress: Not  on file  Social Connections: Not on file   Family History  Problem Relation Age of Onset   Breast cancer Mother 74 - 54   Ovarian cancer Mother 42 - 20   Colon cancer Other        Think someone in his family had colon cancer, not sure who   Inflammatory bowel disease Neg Hx    Outpatient Encounter Medications as of 10/21/2023  Medication Sig   hydrOXYzine (ATARAX) 10 MG tablet Take 10 mg by mouth 2 (two) times daily as needed.   albuterol  (PROAIR  HFA) 108 (90 Base) MCG/ACT inhaler Inhale 2 puffs into the lungs every 6 (six) hours as needed (Asthma).   albuterol  (PROVENTIL ) (2.5 MG/3ML) 0.083% nebulizer solution Take 3 mLs (2.5 mg total) by nebulization every 4 (four) hours as needed for shortness of breath or wheezing.   budesonide -formoterol  (SYMBICORT ) 160-4.5 MCG/ACT inhaler Inhale 2 puffs into the lungs 2 (two) times daily.   cetirizine  (ZYRTEC  ALLERGY ) 10 MG tablet Take 1 tablet (10 mg total) by mouth daily.   levocetirizine (  XYZAL) 5 MG tablet Take 5 mg by mouth every evening.   montelukast  (SINGULAIR ) 10 MG tablet Take 1 tablet (10 mg total) by mouth at bedtime.   sildenafil  (REVATIO ) 20 MG tablet Take 1-5 tablets as needed before sex   tadalafil  (CIALIS ) 5 MG tablet Take 1 - 4 tablets daily PRN prior to sexual activity (Patient not taking: Reported on 10/07/2023)   [DISCONTINUED] cromolyn (OPTICROM) 4 % ophthalmic solution Place 1 drop into both eyes in the morning and at bedtime.   [DISCONTINUED] dicyclomine  (BENTYL ) 20 MG tablet Take 1 tablet (20 mg total) by mouth daily as needed for spasms (sublingual). (Patient not taking: Reported on 10/07/2023)   [DISCONTINUED] omeprazole  (PRILOSEC) 40 MG capsule Take 1 capsule (40 mg total) by mouth 2 (two) times daily.   [DISCONTINUED] ondansetron  (ZOFRAN -ODT) 4 MG disintegrating tablet Take 1 tablet (4 mg total) by mouth every 8 (eight) hours as needed for nausea or vomiting.   [DISCONTINUED] sucralfate  (CARAFATE ) 1 g tablet Take 1 tablet (1 g  total) by mouth 4 (four) times daily -  with meals and at bedtime for 14 days.   No facility-administered encounter medications on file as of 10/21/2023.   ALLERGIES: Allergies  Allergen Reactions   Penicillins Hives   Coconut Fatty Acid Other (See Comments)    I swell up"   Latex Hives    VACCINATION STATUS:  There is no immunization history on file for this patient.  HPI Adam Gallegos is 23 y.o. male who presents today with a medical history as above. he is being seen in consultation for hyperprolactinemia requested by Sarrah Cure, PA-C.   History is obtained directly from the patient as well as chart review.  Patient reports that for the last year he has had symptoms including gynecomastia with galactorrhea, erectile dysfunction, diffuse headaches.  This led to measurement of some hormones.  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  His last labs were from February 2025 which showed prolactin of 48.5-elevated.  His associated testosterone  level was 360 ng per DL.  A previous testosterone  level was 261 in September 2024.   Patient gives history of treatment for ADHD with unidentified medications.  He does not have any children, planning in the future.  He has seen cosmetic surgeon for gynecomastia, however was advised to be treated for hyperprolactinemia first.  He denies any visual field loss.  His headaches are intermittent. He uses sildenafil  on and off for erectile dysfunction.  He smokes and vapes. He shaves regularly.  His other medical problems include asthma for which he is taking Singulair , albuterol , and levocetirizine.  He wishes to be worked up and be treated for hyperprolactinemia as appropriate.     Review of Systems  Constitutional: +mildly fluctuating body weight , no fatigue, no subjective hyperthermia, no subjective hypothermia Eyes: no blurry vision, no xerophthalmia ENT: no sore throat, no nodules palpated in throat, no  dysphagia/odynophagia, no hoarseness Cardiovascular: no Chest Pain, no Shortness of Breath, no palpitations, no leg swelling Respiratory: no cough, no shortness of breath Gastrointestinal: no Nausea/Vomiting/Diarhhea Musculoskeletal: no muscle/joint aches Skin: no rashes Neurological: no tremors, no numbness, no tingling, no dizziness Psychiatric: no depression, no anxiety  Objective:        10/21/2023    8:25 AM 10/07/2023   12:00 PM 09/14/2023   10:22 AM  Vitals with BMI  Height 6\' 0"  6\' 0"  6\' 0"   Weight 191 lbs 3 oz 185 lbs 185 lbs 3 oz  BMI 25.93 25.08 25.11  Systolic 108 114 161  Diastolic 64 70 75  Pulse 72 100 102    BP 108/64   Pulse 72   Ht 6' (1.829 m)   Wt 191 lb 3.2 oz (86.7 kg)   BMI 25.93 kg/m   Wt Readings from Last 3 Encounters:  10/21/23 191 lb 3.2 oz (86.7 kg)  10/07/23 185 lb (83.9 kg)  09/14/23 185 lb 3.2 oz (84 kg)    Physical Exam  Constitutional:  Body mass index is 25.93 kg/m.,  not in acute distress, normal state of mind Eyes: PERRLA, EOMI, no exophthalmos ENT: moist mucous membranes, no gross thyromegaly, no gross cervical lymphadenopathy Cardiovascular: normal precordial activity, Regular Rate and Rhythm, no Murmur/Rubs/Gallops Respiratory:  adequate breathing efforts, no gross chest deformity, Clear to auscultation bilaterally Gastrointestinal: abdomen soft, Non -tender, No distension, Bowel Sounds present, no gross organomegaly Musculoskeletal: no gross deformities, strength intact in all four extremities, no peripheral edema Skin: moist, warm, no rashes Breasts: Bilateral gynecomastia, no galactorrhea, nontender.  No glandular tissue was felt. Neurological: no tremor with outstretched hands, Deep tendon reflexes normal in bilateral lower extremities.    Latest Reference Range & Units 02/21/23 11:33 07/19/23 08:40 07/25/23 11:52  LH 1.7 - 8.6 mIU/mL   8.5  FSH 1.5 - 12.4 mIU/mL   3.3  Prolactin 3.6 - 31.5 ng/mL   48.5 (H)  Estradiol   pg/mL   14  Sex Horm Binding Glob, Serum 16.5 - 55.9 nmol/L 17.6    Testosterone  264 - 916 ng/dL 096 (L) 045   Testosterone  Free 9.3 - 26.5 pg/mL 8.3 (L)     Assessment & Plan:   1. Hyperprolactinemia (HCC) (Primary)  2. Smoker  3. ED  - Asenath Blacker Atchley  is being seen at a kind request of Sarrah Cure, PA-C. - I have reviewed his available  records and clinically evaluated the patient. - Based on these reviews, he has hyperprolactinemia,  however,  there is not sufficient information to proceed with definitive treatment plan. -It is not clear if this hyperprolactinemia was triggered by his ADHD medications or if it is secondary to a neoplastic process. - he will need a repeat,  more complete workup towards confirming the diagnosis.  He will be sent for fasting blood work for: - Prolactin - Testosterone , Free, Total, SHBG - TSH - T4, free - Cortisol-am, blood  - If he returns with significant hyperprolactinemia, he will be considered for pituitary/sella MRI.  If it is only mild elevation, he will be considered for cabergoline intervention. -In terms of his ED, he would benefit from smoking cessation. The patient was counseled on the dangers of tobacco use, and was advised to quit.  Reviewed strategies to maximize success, including removing cigarettes and smoking materials from environment. His labs will also include total testosterone , a.m. cortisol, and thyroid function test.  - he is advised to maintain close follow up with Sarrah Cure, PA-C for primary care needs.   -Thank you for involving me in the care of this pleasant patient.  Time spent with the patient: 45  minutes spent in  counseling him about hyperprolactinemia and the rest in obtaining information about his symptoms, reviewing his previous labs/studies (including abstractions from other facilities),  evaluations, and treatments,  and developing a plan to confirm diagnosis and long term treatment based on the latest  standards of care/guidelines; and documenting his care.  Asenath Blacker Ruffins participated in the discussions, expressed understanding, and voiced  agreement with the above plans.  All questions were answered to his satisfaction. he is encouraged to contact clinic should he have any questions or concerns prior to his return visit.  Follow up plan: Return in about 10 days (around 10/31/2023) for Fasting Labs  in AM B4 8.   Kalvin Orf, MD [facility] [address] [place], [place] [address] Phone: [phone]  Fax: [phone]     10/21/2023, 9:02 AM  This note was partially dictated with voice recognition software. Similar sounding words can be transcribed inadequately or may not  be corrected upon review.

## 2023-10-24 ENCOUNTER — Ambulatory Visit: Payer: MEDICAID | Admitting: Urology

## 2023-10-24 ENCOUNTER — Encounter: Payer: Self-pay | Admitting: Urology

## 2023-10-24 VITALS — BP 114/68 | HR 105

## 2023-10-24 DIAGNOSIS — R7989 Other specified abnormal findings of blood chemistry: Secondary | ICD-10-CM | POA: Diagnosis not present

## 2023-10-24 DIAGNOSIS — N529 Male erectile dysfunction, unspecified: Secondary | ICD-10-CM

## 2023-10-24 NOTE — Progress Notes (Unsigned)
 10/24/2023 11:56 AM   Adam Gallegos [birth year] [id number]  Referring provider: Sarrah Cure, PA-C [address] [place],  [place] [address]  No chief complaint on file.   HPI:  F/u -     1) ED - he has issues getting and maintaining erection. He tried a "blue chew" that contained vardenifil which helped but was expensive. ED for as long as he can remember.  Rx for sildenafil , but he had HA and lightheaded. Trial of tadalafil . He did not pick up tadalafil  and hasn't been sex active.    2) low T - He has been on ADHD meds and steroids for asthma. He had somewhat delayed puberty. They stopped meds and he hit puberty at age 83. He also c/o low libido and fatigue. He is tired during the day. He feels like he is gaining weight. Can't lose weight. Minimal fluid when he ejaculates. He feels like his testicle are small. His Aug 2024 T was 311, free T 75. He would like to have kids. Here with his significant other. He has no kids. She has a 68 mo old.    His Sep 2024 T 261. His Feb 2025 T 360.    3) gynecomastia - he describes more of a "male body type - gains weight in hips" - saw Gen surg in [place]. Referred to Dr. Delane Fear to review surgical options.    Today, seen for the above. Endo working up prolactin.   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   PMH: Past Medical History:  Diagnosis Date   ADD (attention deficit disorder)    ADHD (attention deficit hyperactivity disorder)    ADHD (attention deficit hyperactivity disorder)    Anxiety    Anxiety    Asthma    Autism    Autism    Hepatic steatosis    ODD (oppositional defiant disorder)    Tourette's    Tourette's disease     Surgical History: Past Surgical History:  Procedure Laterality Date   BALLOON DILATION N/A 12/15/2022   Procedure: BALLOON DILATION;  Surgeon: Vinetta Greening, DO;  [facility];  Service: Endoscopy;  Laterality: N/A;   BIOPSY  12/15/2022   Procedure: BIOPSY;  Surgeon: Vinetta Greening, DO;  [facility];  Service: Endoscopy;;   DENTAL SURGERY     ESOPHAGOGASTRODUODENOSCOPY (EGD) WITH PROPOFOL  N/A 12/15/2022   Procedure: ESOPHAGOGASTRODUODENOSCOPY (EGD) WITH PROPOFOL ;  Surgeon: Vinetta Greening, DO;  [facility];  Service: Endoscopy;  Laterality: N/A;  2:30 pm, asa 2    Home Medications:  Allergies as of 10/24/2023       Reactions   Penicillins Hives   Coconut Fatty Acid Other (See Comments)   I swell up"   Latex Hives        Medication List        Accurate as of Oct 24, 2023 11:56 AM. If you have any questions, ask your nurse or doctor.          albuterol  108 (90 Base) MCG/ACT inhaler Commonly known as: ProAir  HFA Inhale 2 puffs into the lungs every 6 (six) hours as needed (Asthma).   albuterol  (2.5 MG/3ML) 0.083% nebulizer solution Commonly known as: PROVENTIL  Take 3 mLs (2.5 mg total) by nebulization every 4 (four) hours as needed for shortness of breath or wheezing.   budesonide -formoterol  160-4.5 MCG/ACT inhaler Commonly known as: Symbicort  Inhale 2 puffs into the lungs 2 (two) times daily.  cetirizine  10 MG tablet Commonly known as: ZyrTEC  Allergy  Take 1 tablet (10 mg total) by mouth daily.   hydrOXYzine 10 MG tablet Commonly known as: ATARAX Take 10 mg by mouth 2 (two) times daily as needed.   levocetirizine 5 MG tablet Commonly known as: XYZAL Take 5 mg by mouth every evening.   montelukast  10 MG tablet Commonly known as: SINGULAIR  Take 1 tablet (10 mg total) by mouth at bedtime.   sildenafil  20 MG tablet Commonly known as: REVATIO  Take 1-5 tablets as needed before sex   tadalafil  5 MG tablet Commonly known as: CIALIS  Take 1 - 4 tablets daily PRN prior to sexual activity        Allergies:  Allergies  Allergen Reactions   Penicillins Hives   Coconut Fatty Acid Other (See Comments)    I swell up"   Latex Hives    Family History: Family History  Problem Relation Age of Onset   Breast  cancer Mother 55 - 13   Ovarian cancer Mother 72 - 44   Colon cancer Other        Think someone in his family had colon cancer, not sure who   Inflammatory bowel disease Neg Hx     Social History:  reports that he has been smoking e-cigarettes. His smokeless tobacco use includes snuff. He reports that he does not drink alcohol and does not use drugs.   Physical Exam: There were no vitals taken for this visit.  Constitutional:  Alert and oriented, No acute distress. HEENT: [place] AT, moist mucus membranes.  Trachea midline, no masses. Cardiovascular: No clubbing, cyanosis, or edema. Respiratory: Normal respiratory effort, no increased work of breathing. GI: Abdomen is soft, nontender, nondistended, no abdominal masses GU: No CVA tenderness Skin: No rashes, bruises or suspicious lesions. Neurologic: Grossly intact, no focal deficits, moving all 4 extremities. Psychiatric: Normal mood and affect.  Laboratory Data: No results found for: "WBC", "HGB", "HCT", "MCV", "PLT"  No results found for: "CREATININE"  No results found for: "PSA"  Lab Results  Component Value Date   TESTOSTERONE  360 07/19/2023    No results found for: "HGBA1C"  Urinalysis    Component Value Date/Time   APPEARANCEUR Clear 07/25/2023 1135   GLUCOSEU Negative 07/25/2023 1135   BILIRUBINUR Negative 07/25/2023 1135   PROTEINUR Negative 07/25/2023 1135   NITRITE Negative 07/25/2023 1135   LEUKOCYTESUR Negative 07/25/2023 1135    Lab Results  Component Value Date   LABMICR Comment 07/25/2023    Pertinent Imaging: N/a   Assessment & Plan:    ED - I also recommended he stop smoking and discussed the link between smoking and CV disease/ED and GU cancers (kidney and bladder).   Low T - f/u with endocrine - appreciate Dr. Chrystine Crate excellent care.    No follow-ups on file.  Christina Coyer, MD  [facility]  [address] [place], [place] [address] [phone]

## 2023-10-28 LAB — TSH: TSH: 3.35 u[IU]/mL (ref 0.450–4.500)

## 2023-10-28 LAB — TESTOSTERONE, FREE, TOTAL, SHBG
Sex Hormone Binding: 18.8 nmol/L (ref 16.5–55.9)
Testosterone, Free: 5 pg/mL — ABNORMAL LOW (ref 9.3–26.5)
Testosterone: 303 ng/dL (ref 264–916)

## 2023-10-28 LAB — T4, FREE: Free T4: 1.39 ng/dL (ref 0.82–1.77)

## 2023-10-28 LAB — CORTISOL-AM, BLOOD: Cortisol - AM: 21.2 ug/dL — ABNORMAL HIGH (ref 6.2–19.4)

## 2023-10-28 LAB — PROLACTIN: Prolactin: 63 ng/mL — ABNORMAL HIGH (ref 3.6–31.5)

## 2023-11-01 ENCOUNTER — Encounter: Payer: Self-pay | Admitting: "Endocrinology

## 2023-11-01 ENCOUNTER — Telehealth: Payer: Self-pay | Admitting: "Endocrinology

## 2023-11-01 ENCOUNTER — Other Ambulatory Visit: Payer: Self-pay | Admitting: "Endocrinology

## 2023-11-01 ENCOUNTER — Ambulatory Visit (INDEPENDENT_AMBULATORY_CARE_PROVIDER_SITE_OTHER): Payer: MEDICAID | Admitting: "Endocrinology

## 2023-11-01 VITALS — BP 104/72 | HR 72 | Ht 72.0 in

## 2023-11-01 DIAGNOSIS — E270 Other adrenocortical overactivity: Secondary | ICD-10-CM

## 2023-11-01 DIAGNOSIS — E221 Hyperprolactinemia: Secondary | ICD-10-CM

## 2023-11-01 DIAGNOSIS — N529 Male erectile dysfunction, unspecified: Secondary | ICD-10-CM

## 2023-11-01 DIAGNOSIS — F17210 Nicotine dependence, cigarettes, uncomplicated: Secondary | ICD-10-CM | POA: Diagnosis not present

## 2023-11-01 MED ORDER — CABERGOLINE 0.5 MG PO TABS: 0.5000 mg | ORAL_TABLET | ORAL | 1 refills | Status: AC

## 2023-11-01 MED ORDER — CABERGOLINE 0.5 MG PO TABS
0.5000 mg | ORAL_TABLET | ORAL | 1 refills | Status: DC
Start: 2023-11-03 — End: 2023-11-01

## 2023-11-01 NOTE — Telephone Encounter (Signed)
 Pt has called back and looks like its scheduled for 11/03/23.  Can we switch it to today?

## 2023-11-01 NOTE — Telephone Encounter (Signed)
 Pt called the after hours line inquiring about a RX you were going to send in for him. He said the pharmacy does not have anything. Please Advise

## 2023-11-01 NOTE — Progress Notes (Signed)
 11/01/2023, 10:45 AM  Endocrinology follow-up note   Subjective:    Patient ID: Adam Gallegos, male    DOB: 11/20/2000, PCP Adam Cure, PA-C   Past Medical History:  Diagnosis Date   ADD (attention deficit disorder)    ADHD (attention deficit hyperactivity disorder)    ADHD (attention deficit hyperactivity disorder)    Anxiety    Anxiety    Asthma    Autism    Autism    Hepatic steatosis    ODD (oppositional defiant disorder)    Tourette's    Tourette's disease    Past Surgical History:  Procedure Laterality Date   BALLOON DILATION N/A 12/15/2022   Procedure: BALLOON DILATION;  Surgeon: Adam Greening, DO;  [facility];  Service: Endoscopy;  Laterality: N/A;   BIOPSY  12/15/2022   Procedure: BIOPSY;  Surgeon: Adam Greening, DO;  [facility];  Service: Endoscopy;;   DENTAL SURGERY     ESOPHAGOGASTRODUODENOSCOPY (EGD) WITH PROPOFOL  N/A 12/15/2022   Procedure: ESOPHAGOGASTRODUODENOSCOPY (EGD) WITH PROPOFOL ;  Surgeon: Adam Greening, DO;  [facility];  Service: Endoscopy;  Laterality: N/A;  2:30 pm, asa 2   Social History   Socioeconomic History   Marital status: Single    Spouse name: Not on file   Number of children: Not on file   Years of education: Not on file   Highest education level: Not on file  Occupational History   Not on file  Tobacco Use   Smoking status: Some Days    Types: E-cigarettes   Smokeless tobacco: Current    Types: Snuff  Vaping Use   Vaping status: Every Day  Substance and Sexual Activity   Alcohol use: No   Drug use: No   Sexual activity: Yes    Birth control/protection: Condom  Other Topics Concern   Not on file  Social History Narrative   Not on file   Social Drivers of Health   Financial Resource Strain: Not on file  Food Insecurity: Not on file  Transportation Needs: Not on file  Physical Activity: Not on file   Stress: Not on file  Social Connections: Not on file   Family History  Problem Relation Age of Onset   Breast cancer Mother 67 - 24   Ovarian cancer Mother 28 - 37   Colon cancer Other        Think someone in his family had colon cancer, not sure who   Inflammatory bowel disease Neg Hx    Outpatient Encounter Medications as of 11/01/2023  Medication Sig   [START ON 11/03/2023] cabergoline (DOSTINEX) 0.5 MG tablet Take 1 tablet (0.5 mg total) by mouth 2 (two) times a week.   albuterol  (PROAIR  HFA) 108 (90 Base) MCG/ACT inhaler Inhale 2 puffs into the lungs every 6 (six) hours as needed (Asthma).   albuterol  (PROVENTIL ) (2.5 MG/3ML) 0.083% nebulizer solution Take 3 mLs (2.5 mg total) by nebulization every 4 (four) hours as needed for shortness of breath or wheezing.   budesonide -formoterol  (SYMBICORT ) 160-4.5 MCG/ACT inhaler Inhale 2 puffs into the lungs 2 (two) times daily.   cetirizine  (ZYRTEC  ALLERGY ) 10 MG tablet Take 1 tablet (10 mg total)  by mouth daily.   hydrOXYzine (ATARAX) 10 MG tablet Take 10 mg by mouth 2 (two) times daily as needed.   levocetirizine (XYZAL) 5 MG tablet Take 5 mg by mouth every evening.   montelukast  (SINGULAIR ) 10 MG tablet Take 1 tablet (10 mg total) by mouth at bedtime.   sildenafil  (REVATIO ) 20 MG tablet Take 1-5 tablets as needed before sex   tadalafil  (CIALIS ) 5 MG tablet Take 1 - 4 tablets daily PRN prior to sexual activity (Patient not taking: Reported on 10/24/2023)   No facility-administered encounter medications on file as of 11/01/2023.   ALLERGIES: Allergies  Allergen Reactions   Penicillins Hives   Coconut Fatty Acid Other (See Comments)    I swell up"   Latex Hives    VACCINATION STATUS:  There is no immunization history on file for this patient.  HPI Adam Gallegos is 23 y.o. male who presents today with a medical history as above. he is being seen in follow-up after he was seen in consultation for hyperprolactinemia requested by  Adam Cure, PA-C.   He was sent for more complete endocrine workup after his last visit.  He returns to discuss his labs today.  Patient reports that for the last year he has had symptoms including gynecomastia with galactorrhea, erectile dysfunction, diffuse headaches.  This led to measurement of some hormones.  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  His last labs were from February 2025 which showed prolactin of 48.5-elevated.  His associated testosterone  level was 360 ng per DL.  A previous testosterone  level was 261 in September 2024.    On his repeat labs, prolactin is increasing to 63.0, testosterone  stable at 303.  His a.m. cortisol was also elevated at 21.  He is not on steroids. Patient gives history of treatment for ADHD with unidentified medications.  He does not have any children, planning in the future.  He has seen cosmetic surgeon for gynecomastia, however was advised to be treated for hyperprolactinemia first.  He denies any visual field loss.  His headaches are intermittent. He uses sildenafil  on and off for erectile dysfunction.  He smokes and vapes. He shaves regularly.  His other medical problems include asthma for which he is taking Singulair , albuterol , and levocetirizine.  He wishes to be worked up and be treated for hyperprolactinemia as appropriate.     Review of Systems  Constitutional: +mildly fluctuating body weight , no fatigue, no subjective hyperthermia, no subjective hypothermia Eyes: no blurry vision, no xerophthalmia   Objective:        11/01/2023    8:49 AM 10/24/2023   12:02 PM 10/21/2023    8:25 AM  Vitals with BMI  Height 6\' 0"   6\' 0"   Weight   191 lbs 3 oz  BMI   25.93  Systolic 104 114 161  Diastolic 72 68 64  Pulse 72 105 72    BP 104/72   Pulse 72   Ht 6' (1.829 m)   BMI 25.93 kg/m   Wt Readings from Last 3 Encounters:  10/21/23 191 lb 3.2 oz (86.7 kg)  10/07/23 185 lb (83.9 kg)  09/14/23 185 lb 3.2 oz (84  kg)    Physical Exam  Constitutional:  Body mass index is 25.93 kg/m.,  not in acute distress, normal state of mind Eyes: PERRLA, EOMI, no exophthalmos ENT: moist mucous membranes, no gross thyromegaly, no gross cervical lymphadenopathy Cardiovascular: normal precordial activity, Regular Rate and Rhythm, no Murmur/Rubs/Gallops  Respiratory:  adequate breathing efforts, no gross chest deformity, Clear to auscultation bilaterally Gastrointestinal: abdomen soft, Non -tender, No distension, Bowel Sounds present, no gross organomegaly Musculoskeletal: no gross deformities, strength intact in all four extremities, no peripheral edema Skin: moist, warm, no rashes Breasts: Bilateral gynecomastia, no galactorrhea, nontender.  No glandular tissue was felt. Neurological: no tremor with outstretched hands, Deep tendon reflexes normal in bilateral lower extremities.    Latest Reference Range & Units 02/21/23 11:33 07/19/23 08:40 07/25/23 11:52  LH 1.7 - 8.6 mIU/mL   8.5  FSH 1.5 - 12.4 mIU/mL   3.3  Prolactin 3.6 - 31.5 ng/mL   48.5 (H)  Estradiol  pg/mL   14  Sex Horm Binding Glob, Serum 16.5 - 55.9 nmol/L 17.6    Testosterone  264 - 916 ng/dL 161 (L) 096   Testosterone  Free 9.3 - 26.5 pg/mL 8.3 (L)     Assessment & Plan:   1. Hyperprolactinemia (HCC) (Primary)  2. Smoker  3. ED 4.  Hypocortisolemia  - I have reviewed his new and available  records and clinically evaluated the patient. - Based on these reviews, he has hyperprolactinemia complicated by gynecomastia.  Based on his labs, it is unlikely that he has dilatory neoplasm.   He will be given treatment with cabergoline 0.25 mg p.o. twice weekly with plan to repeat prolactin in 3 months.  he will not need pituitary/sella imaging at this time. He will need treatment at least for 2 years to maintain prolactin in the normal range. Based on his elevated a.m. 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 He is euthyroid.  His testosterone  is 303, adequate for reproduction.  I discussed with him the fact that inappropriate testosterone  treatment will diminish his fertility .  He would be better off without testosterone  prescription since fertility is in his future plan.   -In terms of his ED, he would benefit from smoking cessation. The patient was counseled on the dangers of tobacco use, and was advised to quit.  Reviewed strategies to maximize success, including removing cigarettes and smoking materials from environment.  - he is advised to maintain close follow up with Adam Cure, PA-C for primary care needs.   I spent  25  minutes in the care of the patient today including review of labs from Thyroid Function, CMP, and other relevant labs ; imaging/biopsy records (current and previous including abstractions from other facilities); face-to-face time discussing  his lab results and symptoms, medications doses, his options of short and long term treatment based on the latest standards of care / guidelines;   and documenting the encounter.  Asenath Blacker Dejoseph  participated in the discussions, expressed understanding, and voiced agreement with the above plans.  All questions were answered to his satisfaction. he is encouraged to contact clinic should he have any questions or concerns prior to his return visit.   Follow up plan: Return in about 3 months (around 02/01/2024) for Fasting Labs  in AM B4 8, 24 Hr Urine Free Cortisol & Cr.   Kalvin Orf, MD [facility] [address] [place], [place] [address] Phone: [phone]  Fax: [phone]     11/01/2023, 10:45 AM  This note was partially dictated with voice recognition software. Similar sounding words can be transcribed inadequately or may not  be corrected upon review.

## 2023-11-07 ENCOUNTER — Ambulatory Visit: Payer: Self-pay | Admitting: "Endocrinology

## 2023-12-19 ENCOUNTER — Ambulatory Visit: Payer: MEDICAID | Admitting: Allergy

## 2023-12-19 ENCOUNTER — Encounter: Payer: Self-pay | Admitting: Allergy & Immunology

## 2023-12-19 ENCOUNTER — Ambulatory Visit (INDEPENDENT_AMBULATORY_CARE_PROVIDER_SITE_OTHER): Payer: MEDICAID | Admitting: Allergy & Immunology

## 2023-12-19 ENCOUNTER — Other Ambulatory Visit: Payer: Self-pay

## 2023-12-19 VITALS — BP 108/68 | HR 104 | Temp 98.2°F | Resp 18 | Ht 70.08 in | Wt 182.0 lb

## 2023-12-19 DIAGNOSIS — J3089 Other allergic rhinitis: Secondary | ICD-10-CM

## 2023-12-19 DIAGNOSIS — J302 Other seasonal allergic rhinitis: Secondary | ICD-10-CM

## 2023-12-19 DIAGNOSIS — J4520 Mild intermittent asthma, uncomplicated: Secondary | ICD-10-CM

## 2023-12-19 DIAGNOSIS — K219 Gastro-esophageal reflux disease without esophagitis: Secondary | ICD-10-CM | POA: Diagnosis not present

## 2023-12-19 MED ORDER — ALBUTEROL SULFATE HFA 108 (90 BASE) MCG/ACT IN AERS: 2.0000 | INHALATION_SPRAY | Freq: Four times a day (QID) | RESPIRATORY_TRACT | 1 refills | Status: DC | PRN

## 2023-12-19 NOTE — Patient Instructions (Addendum)
 Asthma - Lung testing looked a little low today. - We are not going to make any changes at this time. - Spacer use reviewed. - Daily controller medication(s): Symbicort  160/4.74mcg two puffs twice daily with spacer - Prior to physical activity: albuterol  2 puffs 10-15 minutes before physical activity. - Rescue medications: albuterol  4 puffs every 4-6 hours as needed - Asthma control goals:  * Full participation in all desired activities (may need albuterol  before activity) * Albuterol  use two time or less a week on average (not counting use with activity) * Cough interfering with sleep two time or less a month * Oral steroids no more than once a year * No hospitalizations  Allergic rhinitis (grasses, weeds, trees, mold, dust mites, cat, cockroach, horse) - Continue montelukast  10 mg once a day to control allergy  symptoms. - Continue with the levocetirizine 5mg  once a day.  - This seems to be working well for your symptoms.  - Allergy  shot consent signed today. - Make an appointment to start allergy  shots in 2-3 weeks.   Reflux - Continue omeprazole  40 mg twice a day as previously prescribed - Continue lifestyle and diet modifications as listed below - Follow-up with your GI provider as recommended  Return in about 6 months (around 06/20/2024). You can have the follow up appointment with Dr. Iva or a Nurse Practicioner (our Nurse Practitioners are excellent and always have Physician oversight!).    Please inform us  of any Emergency Department visits, hospitalizations, or changes in symptoms. Call us  before going to the ED for breathing or allergy  symptoms since we might be able to fit you in for a sick visit. Feel free to contact us  anytime with any questions, problems, or concerns.  It was a pleasure to meet you today!  Websites that have reliable patient information: 1. American Academy of Asthma, Allergy , and Immunology: www.aaaai.org 2. Food Allergy  Research and Education  (FARE): foodallergy.org 3. Mothers of Asthmatics: http://www.asthmacommunitynetwork.org 4. American College of Allergy , Asthma, and Immunology: www.acaai.org      "Like" us  on Facebook and Instagram for our latest updates!      A healthy democracy works best when [employer] participate! Make sure you are registered to vote! If you have moved or changed any of your contact information, you will need to get this updated before voting! Scan the QR codes below to learn more!

## 2023-12-19 NOTE — Progress Notes (Unsigned)
 FOLLOW UP  Date of Service/Encounter:  12/20/23   Assessment:   Mild intermittent asthma without complication  Seasonal and perennial allergic rhinitis  Gastroesophageal reflux disease  Plan/Recommendations:   Asthma - Lung testing looks amazing today. - We are not going to make any changes at this time. - Spacer use reviewed. - Daily controller medication(s): Symbicort  160/4.28mcg two puffs twice daily with spacer - Prior to physical activity: albuterol  2 puffs 10-15 minutes before physical activity. - Rescue medications: albuterol  4 puffs every 4-6 hours as needed - Asthma control goals:  * Full participation in all desired activities (may need albuterol  before activity) * Albuterol  use two time or less a week on average (not counting use with activity) * Cough interfering with sleep two time or less a month * Oral steroids no more than once a year * No hospitalizations  Allergic rhinitis (grasses, weeds, trees, mold, dust mites, cat, cockroach, horse) - Continue montelukast  10 mg once a day to control allergy  symptoms. - Continue with the levocetirizine 5mg  once a day.  - This seems to be working well for your symptoms.  - Allergy  shot consent signed today. - Make an appointment to start allergy  shots in 2-3 weeks.   Reflux - Continue omeprazole  40 mg twice a day as previously prescribed - Continue lifestyle and diet modifications as listed below - Follow-up with your GI provider as recommended  Return in about 6 months (around 06/20/2024). You can have the follow up appointment with Dr. Iva or a Nurse Practicioner (our Nurse Practitioners are excellent and always have Physician oversight!).   Subjective:   Adam Gallegos is a 23 y.o. male presenting today for follow up of  Chief Complaint  Patient presents with   Asthma    Pt reports that Adam Gallegos has no questions or concerns.     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  Discussed the use of AI scribe software for clinical note transcription with the patient and/or guardian, who gave verbal consent to proceed.  Adam Gallegos is a 23 y.o. male presenting for a follow up visit.  Adam Gallegos was last seen May 2025 by Arlean Mutter, formernurse.  At that time, Adam Gallegos was started Dulera 200 mcg 2 puffs twice daily as well as Singulair .  For his allergic rhinitis, Adam Gallegos continued on montelukast .  Adam Gallegos was started on carbinoxamine.  For his omeprazole , Adam Gallegos was started on 40 mg daily.  Since last visit, Adam Gallegos has done well overall. But Adam Gallegos is now interested in starting allergy  shots.   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 All of his medications are covered by insurance.  Allergic Rhinitis Symptom History: His allergies are most problematic in the fall and spring, with cold weather also exacerbating his symptoms.  Adam Gallegos takes Benadryl as needed, but not frequently, and cannot use nasal sprays.  Adam Gallegos has not experienced any sinus or ear infections recently.  Adam Gallegos hey carries  emergency medication in his car in case of an allergic reaction.  Infection Symptom History: Adam Gallegos has not had any recent sinus infections, ear infections, or pneumonias.  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  Adam Gallegos is considering going into nursing, specializing in pediatrics.  Adam Gallegos and his fiance met  at a beekeeping store.  They have been dating for 4 months.  They have been friends for a lot longer than that.  Otherwise, there have been no changes to his past medical history, surgical history, family history, or social history.    Review of systems otherwise negative other than that mentioned in the HPI.    Objective:   Blood pressure 108/68, pulse (!) 104, temperature 98.2 F (36.8 C), temperature source Temporal, resp. rate 18, height 5' 10.08 (1.78 m), weight 182 lb (82.6 kg), SpO2 96%. Body mass index is 26.06 kg/m.    Physical Exam Vitals reviewed.  Constitutional:      Appearance: Adam Gallegos is well-developed.     Comments: Smiling.  Friendly.  HENT:     Head: Normocephalic and atraumatic.     Right Ear: Tympanic membrane, ear canal and external ear normal.     Left Ear: Tympanic membrane, ear canal and external ear normal.     Nose: No nasal deformity, septal deviation, mucosal edema or rhinorrhea.     Right Turbinates: Enlarged, swollen and pale.     Left Turbinates: Enlarged, swollen and pale.     Right Sinus: No maxillary sinus tenderness or frontal sinus tenderness.     Left Sinus: No maxillary sinus tenderness or frontal sinus tenderness.     Mouth/Throat:     Mouth: Mucous membranes are not pale and not dry.     Pharynx: Uvula midline.  Eyes:     General: Lids are normal. No allergic shiner.       Right eye: No discharge.        Left eye: No discharge.     Conjunctiva/sclera: Conjunctivae normal.     Right eye: Right conjunctiva is not injected. No chemosis.    Left eye: Left conjunctiva is not injected. No chemosis.    Pupils: Pupils are equal, round, and reactive to light.  Cardiovascular:     Rate and Rhythm: Normal rate and regular rhythm.     Heart sounds: Normal heart sounds.  Pulmonary:     Effort: Pulmonary effort is normal. No tachypnea, accessory muscle usage or respiratory distress.     Breath sounds: Normal breath sounds. No wheezing, rhonchi or  rales.  Chest:     Chest wall: No tenderness.  Lymphadenopathy:     Cervical: No cervical adenopathy.  Skin:    Coloration: Skin is not pale.     Findings: No abrasion, erythema, petechiae or rash. Rash is not papular, urticarial or vesicular.  Neurological:     Mental Status: Adam Gallegos is alert.  Psychiatric:        Behavior: Behavior is cooperative.      Diagnostic studies:    Spirometry: results normal (FEV1: 3.06/66%, FVC: 4.91/89%, FEV1/FVC: 62%).    Spirometry consistent with moderate obstructive disease.   Allergy  Studies: none       Adam Shaggy, MD  [facility] 

## 2024-01-16 ENCOUNTER — Telehealth: Payer: Self-pay | Admitting: Allergy & Immunology

## 2024-01-16 MED ORDER — ALBUTEROL SULFATE HFA 108 (90 BASE) MCG/ACT IN AERS
2.0000 | INHALATION_SPRAY | Freq: Four times a day (QID) | RESPIRATORY_TRACT | 1 refills | Status: DC | PRN
Start: 2024-01-16 — End: 2024-03-12

## 2024-01-16 MED ORDER — MONTELUKAST SODIUM 10 MG PO TABS: 10.0000 mg | ORAL_TABLET | Freq: Every day | ORAL | 5 refills | Status: AC

## 2024-01-16 NOTE — Telephone Encounter (Signed)
 Refills have been sent in. I called the patient and informed.
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
## 2024-01-31 ENCOUNTER — Telehealth: Payer: Self-pay | Admitting: "Endocrinology

## 2024-01-31 NOTE — Telephone Encounter (Signed)
 Pt states he is needing a refill on cabergoline , pt has not done labs and doesn't know when he will be able to get those done.

## 2024-02-01 ENCOUNTER — Ambulatory Visit: Payer: MEDICAID | Admitting: "Endocrinology

## 2024-02-02 NOTE — Telephone Encounter (Signed)
 Spoke with pt making him aware we need current labs for adjustment to his medication per Dr.Nida. Pt voiced understanding stating he would try to have labs drawn next week  Stated he has not taken the cabergoline  in 1-2 months.

## 2024-03-10 ENCOUNTER — Other Ambulatory Visit: Payer: Self-pay | Admitting: Allergy & Immunology

## 2024-05-07 ENCOUNTER — Ambulatory Visit: Payer: PRIVATE HEALTH INSURANCE | Admitting: Urology

## 2024-06-20 ENCOUNTER — Other Ambulatory Visit: Payer: Self-pay | Admitting: Allergy & Immunology

## 2024-07-06 ENCOUNTER — Other Ambulatory Visit: Payer: Self-pay | Admitting: Family Medicine

## 2024-07-11 ENCOUNTER — Ambulatory Visit: Payer: MEDICAID | Admitting: Allergy & Immunology

## 2024-07-11 ENCOUNTER — Encounter: Payer: Self-pay | Admitting: Allergy & Immunology

## 2024-07-11 VITALS — BP 118/80 | HR 96 | Temp 98.2°F | Wt 193.0 lb

## 2024-07-11 DIAGNOSIS — K219 Gastro-esophageal reflux disease without esophagitis: Secondary | ICD-10-CM

## 2024-07-11 DIAGNOSIS — J4540 Moderate persistent asthma, uncomplicated: Secondary | ICD-10-CM

## 2024-07-11 DIAGNOSIS — J302 Other seasonal allergic rhinitis: Secondary | ICD-10-CM
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
## 2024-07-11 NOTE — Patient Instructions (Addendum)
 Asthma - Lung testing looked a little better today. - We are going to start you on Trelegy once puff once daily (sample provided today). - This contains three medications to help with breathing.  - Demonstration provided today. - We are going to get some labs to look for serious difficult to control causes of asthma. - This will help us  to determine whether we can put you on a biologic medication to help with your asthma control.  - Daily controller medication(s): Trelegy 100/62.5/25mcg one puff once daily - Prior to physical activity: albuterol  2 puffs 10-15 minutes before physical activity. - Rescue medications: albuterol  4 puffs every 4-6 hours as needed - Asthma control goals:  * Full participation in all desired activities (may need albuterol  before activity) * Albuterol  use two time or less a week on average (not counting use with activity) * Cough interfering with sleep two time or less a month * Oral steroids no more than once a year * No hospitalizations  Allergic rhinitis (grasses, weeds, trees, mold, dust mites, cat, cockroach, horse) - Continue montelukast  10 mg once a day to control allergy  symptoms. - Continue with the levocetirizine 5mg  once a day.  - We will try to check on the cost of allergy  shots again.   Reflux - Continue omeprazole  40 mg twice a day as previously prescribed - Continue lifestyle and diet modifications as listed below - Follow-up with your GI provider as recommended  Return in about 4 weeks (around 08/08/2024). You can have the follow up appointment with Dr. Iva or a Nurse Practicioner (our Nurse Practitioners are excellent and always have Physician oversight!).    Please inform us  of any Emergency Department visits, hospitalizations, or changes in symptoms. Call us  before going to the ED for breathing or allergy  symptoms since we might be able to fit you in for a sick visit. Feel free to contact us  anytime with any questions, problems, or  concerns.  It was a pleasure to meet you today!  Websites that have reliable patient information: 1. American Academy of Asthma, Allergy , and Immunology: www.aaaai.org 2. Food Allergy  Research and Education (FARE): foodallergy.org 3. Mothers of Asthmatics: http://www.asthmacommunitynetwork.org 4. American College of Allergy , Asthma, and Immunology: www.acaai.org      Like us  on [employer] and Instagram for our latest updates!      A healthy democracy works best when [employer] participate! Make sure you are registered to vote! If you have moved or changed any of your contact information, you will need to get this updated before voting! Scan the QR codes below to learn more!

## 2024-07-13 ENCOUNTER — Encounter: Payer: Self-pay | Admitting: Allergy & Immunology

## 2024-07-13 NOTE — Addendum Note (Signed)
 Addended by: MENDEZ-MUNGARAY, Adaora Mchaney M on: 07/13/2024 05:36 PM   Modules accepted: Orders

## 2024-09-03 ENCOUNTER — Ambulatory Visit: Payer: MEDICAID | Admitting: Family Medicine
